# Patient Record
Sex: Female | Born: 1955 | Race: White | Hispanic: No | Marital: Single | Smoking: Current every day smoker
Health system: Southern US, Community
[De-identification: ages and names within clinical notes are randomized; demographics above are authoritative.]

## PROBLEM LIST (undated history)

## (undated) DIAGNOSIS — E039 Hypothyroidism, unspecified: Secondary | ICD-10-CM

## (undated) DIAGNOSIS — M5430 Sciatica, unspecified side: Secondary | ICD-10-CM

## (undated) DIAGNOSIS — F1910 Other psychoactive substance abuse, uncomplicated: Secondary | ICD-10-CM

## (undated) DIAGNOSIS — I10 Essential (primary) hypertension: Secondary | ICD-10-CM

## (undated) DIAGNOSIS — E785 Hyperlipidemia, unspecified: Secondary | ICD-10-CM

## (undated) DIAGNOSIS — I2510 Atherosclerotic heart disease of native coronary artery without angina pectoris: Secondary | ICD-10-CM

## (undated) DIAGNOSIS — B1920 Unspecified viral hepatitis C without hepatic coma: Secondary | ICD-10-CM

## (undated) DIAGNOSIS — F419 Anxiety disorder, unspecified: Secondary | ICD-10-CM

## (undated) DIAGNOSIS — J449 Chronic obstructive pulmonary disease, unspecified: Secondary | ICD-10-CM

## (undated) DIAGNOSIS — K219 Gastro-esophageal reflux disease without esophagitis: Secondary | ICD-10-CM

## (undated) DIAGNOSIS — I214 Non-ST elevation (NSTEMI) myocardial infarction: Secondary | ICD-10-CM

## (undated) HISTORY — PX: UPPER GI ENDOSCOPY: SHX6162

## (undated) HISTORY — DX: Unspecified viral hepatitis C without hepatic coma: B19.20

## (undated) HISTORY — PX: TONSILLECTOMY: SUR1361

## (undated) HISTORY — DX: Atherosclerotic heart disease of native coronary artery without angina pectoris: I25.10

## (undated) HISTORY — DX: Non-ST elevation (NSTEMI) myocardial infarction: I21.4

## (undated) HISTORY — DX: Chronic obstructive pulmonary disease, unspecified: J44.9

## (undated) HISTORY — DX: Hypothyroidism, unspecified: E03.9

## (undated) HISTORY — PX: TUBAL LIGATION: SHX77

## (undated) HISTORY — PX: OTHER SURGICAL HISTORY: SHX169

## (undated) HISTORY — DX: Other psychoactive substance abuse, uncomplicated: F19.10

---

## 1997-08-20 ENCOUNTER — Inpatient Hospital Stay (HOSPITAL_COMMUNITY): Admission: AD | Admit: 1997-08-20 | Discharge: 1997-08-24 | Payer: Self-pay | Admitting: Psychiatry

## 1999-11-04 ENCOUNTER — Inpatient Hospital Stay (HOSPITAL_COMMUNITY): Admission: AD | Admit: 1999-11-04 | Discharge: 1999-11-08 | Payer: Self-pay | Admitting: Psychiatry

## 2001-05-27 ENCOUNTER — Other Ambulatory Visit: Admission: RE | Admit: 2001-05-27 | Discharge: 2001-05-27 | Payer: Self-pay | Admitting: Internal Medicine

## 2002-05-25 ENCOUNTER — Inpatient Hospital Stay (HOSPITAL_COMMUNITY): Admission: EM | Admit: 2002-05-25 | Discharge: 2002-05-29 | Payer: Self-pay | Admitting: Psychiatry

## 2003-04-07 ENCOUNTER — Emergency Department (HOSPITAL_COMMUNITY): Admission: EM | Admit: 2003-04-07 | Discharge: 2003-04-07 | Payer: Self-pay | Admitting: Emergency Medicine

## 2003-10-26 ENCOUNTER — Ambulatory Visit (HOSPITAL_COMMUNITY): Admission: RE | Admit: 2003-10-26 | Discharge: 2003-10-26 | Payer: Self-pay | Admitting: General Surgery

## 2004-06-04 ENCOUNTER — Inpatient Hospital Stay (HOSPITAL_COMMUNITY): Admission: EM | Admit: 2004-06-04 | Discharge: 2004-06-07 | Payer: Self-pay | Admitting: Emergency Medicine

## 2004-06-26 ENCOUNTER — Emergency Department (HOSPITAL_COMMUNITY): Admission: EM | Admit: 2004-06-26 | Discharge: 2004-06-26 | Payer: Self-pay | Admitting: Emergency Medicine

## 2005-07-04 ENCOUNTER — Emergency Department (HOSPITAL_COMMUNITY): Admission: EM | Admit: 2005-07-04 | Discharge: 2005-07-04 | Payer: Self-pay | Admitting: Emergency Medicine

## 2005-11-14 ENCOUNTER — Emergency Department (HOSPITAL_COMMUNITY): Admission: EM | Admit: 2005-11-14 | Discharge: 2005-11-15 | Payer: Self-pay | Admitting: Emergency Medicine

## 2005-11-23 ENCOUNTER — Emergency Department (HOSPITAL_COMMUNITY): Admission: EM | Admit: 2005-11-23 | Discharge: 2005-11-23 | Payer: Self-pay | Admitting: Emergency Medicine

## 2005-11-27 ENCOUNTER — Ambulatory Visit (HOSPITAL_COMMUNITY): Admission: RE | Admit: 2005-11-27 | Discharge: 2005-11-27 | Payer: Self-pay | Admitting: Family Medicine

## 2005-12-14 ENCOUNTER — Emergency Department (HOSPITAL_COMMUNITY): Admission: EM | Admit: 2005-12-14 | Discharge: 2005-12-14 | Payer: Self-pay | Admitting: Emergency Medicine

## 2005-12-16 ENCOUNTER — Emergency Department (HOSPITAL_COMMUNITY): Admission: EM | Admit: 2005-12-16 | Discharge: 2005-12-16 | Payer: Self-pay | Admitting: Emergency Medicine

## 2006-01-02 ENCOUNTER — Emergency Department (HOSPITAL_COMMUNITY): Admission: EM | Admit: 2006-01-02 | Discharge: 2006-01-03 | Payer: Self-pay | Admitting: Emergency Medicine

## 2006-01-04 ENCOUNTER — Emergency Department (HOSPITAL_COMMUNITY): Admission: EM | Admit: 2006-01-04 | Discharge: 2006-01-04 | Payer: Self-pay | Admitting: Emergency Medicine

## 2006-01-23 ENCOUNTER — Emergency Department (HOSPITAL_COMMUNITY): Admission: EM | Admit: 2006-01-23 | Discharge: 2006-01-23 | Payer: Self-pay | Admitting: Emergency Medicine

## 2006-04-15 ENCOUNTER — Emergency Department (HOSPITAL_COMMUNITY): Admission: EM | Admit: 2006-04-15 | Discharge: 2006-04-15 | Payer: Self-pay | Admitting: Emergency Medicine

## 2006-05-03 ENCOUNTER — Emergency Department (HOSPITAL_COMMUNITY): Admission: EM | Admit: 2006-05-03 | Discharge: 2006-05-03 | Payer: Self-pay | Admitting: Emergency Medicine

## 2006-05-17 ENCOUNTER — Emergency Department (HOSPITAL_COMMUNITY): Admission: EM | Admit: 2006-05-17 | Discharge: 2006-05-17 | Payer: Self-pay | Admitting: Emergency Medicine

## 2006-07-13 ENCOUNTER — Emergency Department (HOSPITAL_COMMUNITY): Admission: EM | Admit: 2006-07-13 | Discharge: 2006-07-13 | Payer: Self-pay | Admitting: Emergency Medicine

## 2006-08-22 ENCOUNTER — Emergency Department (HOSPITAL_COMMUNITY): Admission: EM | Admit: 2006-08-22 | Discharge: 2006-08-22 | Payer: Self-pay | Admitting: Emergency Medicine

## 2006-09-02 ENCOUNTER — Emergency Department (HOSPITAL_COMMUNITY): Admission: EM | Admit: 2006-09-02 | Discharge: 2006-09-02 | Payer: Self-pay | Admitting: *Deleted

## 2006-09-05 ENCOUNTER — Ambulatory Visit: Payer: Self-pay | Admitting: Psychiatry

## 2006-09-05 ENCOUNTER — Inpatient Hospital Stay (HOSPITAL_COMMUNITY): Admission: AD | Admit: 2006-09-05 | Discharge: 2006-09-08 | Payer: Self-pay | Admitting: Psychiatry

## 2006-09-20 ENCOUNTER — Emergency Department (HOSPITAL_COMMUNITY): Admission: EM | Admit: 2006-09-20 | Discharge: 2006-09-20 | Payer: Self-pay | Admitting: Emergency Medicine

## 2007-07-08 ENCOUNTER — Emergency Department (HOSPITAL_COMMUNITY): Admission: EM | Admit: 2007-07-08 | Discharge: 2007-07-08 | Payer: Self-pay | Admitting: Emergency Medicine

## 2008-03-13 ENCOUNTER — Emergency Department (HOSPITAL_COMMUNITY): Admission: EM | Admit: 2008-03-13 | Discharge: 2008-03-14 | Payer: Self-pay | Admitting: Emergency Medicine

## 2008-04-12 ENCOUNTER — Encounter: Payer: Self-pay | Admitting: Orthopedic Surgery

## 2008-04-15 ENCOUNTER — Emergency Department (HOSPITAL_COMMUNITY): Admission: EM | Admit: 2008-04-15 | Discharge: 2008-04-15 | Payer: Self-pay | Admitting: Emergency Medicine

## 2008-04-19 ENCOUNTER — Emergency Department (HOSPITAL_COMMUNITY): Admission: EM | Admit: 2008-04-19 | Discharge: 2008-04-20 | Payer: Self-pay | Admitting: Emergency Medicine

## 2008-05-01 ENCOUNTER — Ambulatory Visit: Payer: Self-pay | Admitting: Orthopedic Surgery

## 2008-05-01 DIAGNOSIS — M5137 Other intervertebral disc degeneration, lumbosacral region: Secondary | ICD-10-CM | POA: Insufficient documentation

## 2008-05-01 DIAGNOSIS — M5126 Other intervertebral disc displacement, lumbar region: Secondary | ICD-10-CM | POA: Insufficient documentation

## 2008-05-01 DIAGNOSIS — M549 Dorsalgia, unspecified: Secondary | ICD-10-CM | POA: Insufficient documentation

## 2008-05-02 ENCOUNTER — Telehealth: Payer: Self-pay | Admitting: Orthopedic Surgery

## 2008-05-03 ENCOUNTER — Ambulatory Visit (HOSPITAL_COMMUNITY): Admission: RE | Admit: 2008-05-03 | Discharge: 2008-05-03 | Payer: Self-pay | Admitting: Orthopedic Surgery

## 2008-05-04 ENCOUNTER — Emergency Department (HOSPITAL_COMMUNITY): Admission: EM | Admit: 2008-05-04 | Discharge: 2008-05-04 | Payer: Self-pay | Admitting: Emergency Medicine

## 2008-05-08 ENCOUNTER — Ambulatory Visit: Payer: Self-pay | Admitting: Orthopedic Surgery

## 2008-05-12 ENCOUNTER — Telehealth: Payer: Self-pay | Admitting: Orthopedic Surgery

## 2008-05-16 ENCOUNTER — Telehealth: Payer: Self-pay | Admitting: Orthopedic Surgery

## 2008-05-25 ENCOUNTER — Encounter: Payer: Self-pay | Admitting: Orthopedic Surgery

## 2008-08-25 ENCOUNTER — Emergency Department (HOSPITAL_COMMUNITY): Admission: EM | Admit: 2008-08-25 | Discharge: 2008-08-25 | Payer: Self-pay | Admitting: Emergency Medicine

## 2008-10-16 ENCOUNTER — Emergency Department (HOSPITAL_COMMUNITY): Admission: EM | Admit: 2008-10-16 | Discharge: 2008-10-16 | Payer: Self-pay | Admitting: Emergency Medicine

## 2008-10-17 ENCOUNTER — Emergency Department (HOSPITAL_COMMUNITY): Admission: EM | Admit: 2008-10-17 | Discharge: 2008-10-17 | Payer: Self-pay | Admitting: Emergency Medicine

## 2008-10-19 ENCOUNTER — Emergency Department (HOSPITAL_COMMUNITY): Admission: EM | Admit: 2008-10-19 | Discharge: 2008-10-19 | Payer: Self-pay | Admitting: Emergency Medicine

## 2008-11-28 ENCOUNTER — Encounter
Admission: RE | Admit: 2008-11-28 | Discharge: 2008-12-05 | Payer: Self-pay | Admitting: Physical Medicine & Rehabilitation

## 2008-12-05 ENCOUNTER — Ambulatory Visit: Payer: Self-pay | Admitting: Physical Medicine & Rehabilitation

## 2008-12-29 ENCOUNTER — Emergency Department (HOSPITAL_COMMUNITY): Admission: EM | Admit: 2008-12-29 | Discharge: 2008-12-29 | Payer: Self-pay | Admitting: Emergency Medicine

## 2009-08-09 ENCOUNTER — Encounter: Payer: Self-pay | Admitting: Orthopedic Surgery

## 2009-08-09 ENCOUNTER — Telehealth: Payer: Self-pay | Admitting: Orthopedic Surgery

## 2009-10-09 ENCOUNTER — Emergency Department (HOSPITAL_COMMUNITY): Admission: EM | Admit: 2009-10-09 | Discharge: 2009-10-09 | Payer: Self-pay | Admitting: Emergency Medicine

## 2009-12-19 ENCOUNTER — Emergency Department (HOSPITAL_COMMUNITY): Admission: EM | Admit: 2009-12-19 | Discharge: 2009-12-19 | Payer: Self-pay | Admitting: Emergency Medicine

## 2010-04-25 ENCOUNTER — Emergency Department (HOSPITAL_COMMUNITY)
Admission: EM | Admit: 2010-04-25 | Discharge: 2010-04-25 | Payer: Self-pay | Source: Home / Self Care | Admitting: Emergency Medicine

## 2010-04-25 LAB — DIFFERENTIAL
Basophils Absolute: 0 10*3/uL (ref 0.0–0.1)
Basophils Relative: 1 % (ref 0–1)
Eosinophils Absolute: 0.3 10*3/uL (ref 0.0–0.7)
Eosinophils Relative: 6 % — ABNORMAL HIGH (ref 0–5)
Lymphocytes Relative: 43 % (ref 12–46)
Lymphs Abs: 2.1 10*3/uL (ref 0.7–4.0)
Monocytes Absolute: 0.4 10*3/uL (ref 0.1–1.0)
Monocytes Relative: 9 % (ref 3–12)
Neutro Abs: 2 10*3/uL (ref 1.7–7.7)
Neutrophils Relative %: 41 % — ABNORMAL LOW (ref 43–77)

## 2010-04-25 LAB — BASIC METABOLIC PANEL
BUN: 12 mg/dL (ref 6–23)
CO2: 33 mEq/L — ABNORMAL HIGH (ref 19–32)
Calcium: 9.4 mg/dL (ref 8.4–10.5)
Chloride: 95 mEq/L — ABNORMAL LOW (ref 96–112)
Creatinine, Ser: 0.72 mg/dL (ref 0.4–1.2)
GFR calc Af Amer: >60 mL/min (ref >60)
GFR calc non Af Amer: >60 mL/min (ref >60)
Glucose, Bld: 97 mg/dL (ref 70–99)
Potassium: 3.5 mEq/L (ref 3.5–5.1)
Sodium: 136 mEq/L (ref 135–145)

## 2010-04-25 LAB — CBC
HCT: 34.7 % — ABNORMAL LOW (ref 36.0–46.0)
Hemoglobin: 12.2 g/dL (ref 12.0–15.0)
MCH: 31.4 pg (ref 26.0–34.0)
MCHC: 35.2 g/dL (ref 30.0–36.0)
MCV: 89.4 fL (ref 78.0–100.0)
Platelets: 205 10*3/uL (ref 150–400)
RBC: 3.88 MIL/uL (ref 3.87–5.11)
RDW: 13.7 % (ref 11.5–15.5)
WBC: 4.8 10*3/uL (ref 4.0–10.5)

## 2010-04-25 LAB — URINALYSIS, ROUTINE W REFLEX MICROSCOPIC
Bilirubin Urine: NEGATIVE
Hemoglobin, Urine: NEGATIVE
Ketones, ur: NEGATIVE mg/dL
Nitrite: NEGATIVE
Protein, ur: NEGATIVE mg/dL
Specific Gravity, Urine: 1.01 (ref 1.005–1.030)
Urine Glucose, Fasting: NEGATIVE mg/dL
Urobilinogen, UA: 0.2 mg/dL (ref 0.0–1.0)
pH: 6 (ref 5.0–8.0)

## 2010-05-12 ENCOUNTER — Encounter: Payer: Self-pay | Admitting: Orthopedic Surgery

## 2010-05-21 NOTE — Progress Notes (Signed)
Summary: Medical records request [facility]  Phone Note Other Incoming   Caller: [facility], [place] medical records Summary of Call: Medical records/Health Information department called to request copy of patient's medical records, states patient admitted 08/08/09.  Rec'd signed authorization.    I called/ Followed up with [facility] re: referral to their office, as no report received.  Per Susie at [facility], patient an an appointment in August 2010 w/Dr Lovell Sheehan and was a no show.  She was re-scheduled for an appt in September 2010 with Dr Franky Macho, and had cancelled.  Patient has not re-scheduled.  Faxed records as requested to [facility] at fax # [phone]. Initial call taken by: Cammie Sickle,  August 09, 2009 9:21 AM

## 2010-05-21 NOTE — Letter (Signed)
Summary: Medical records request [facility]  Medical records request [facility]   Imported By: Cammie Sickle 08/11/2009 10:51:56  _____________________________________________________________________  External Attachment:    Type:   Image     Comment:   External Document

## 2010-05-21 NOTE — Progress Notes (Signed)
Summary: can she have pain med  Phone Note Call from Patient Call back at [facility] Phone [phone]   Summary of Call: pt states that neurontin not helping much,needs pain med, IVP dye and demerol allergies,  mri appt tomorrow, will call in valium for her today, [place] [employer].   What can she take for pain? Initial call taken by: Ether Griffins,  May 02, 2008 10:39 AM  Follow-up for Phone Call        tell her she has to get any pain meds from her pcp and the health dept  see patient instructions from last ov Follow-up by: Fuller Canada MD,  May 02, 2008 2:37 PM  Additional Follow-up for Phone Call Additional follow up Details #1::        advised pt Additional Follow-up by: Ether Griffins,  May 02, 2008 3:00 PM

## 2010-05-21 NOTE — Letter (Signed)
Summary: *Referral Letter  [facility]  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  Sincerely,  Fuller Canada MD

## 2010-05-21 NOTE — Assessment & Plan Note (Signed)
Summary: mri results from ap/frs    History of Present Illness: I saw [name] in the office today for a followup visit.  She is a 55 years old woman with the complaint of:  lowback pain. 100 percent discount [place].  Patient states she is not much better.  Review MRI results today. Neurontin 100 three times a day, does not help.  IMPRESSION: Moderately large left-sided disc protrusion at L4-5 with impingement of the left L4 and L5 nerve roots.  There is mild to moderate spinal stenosis.   Central disc protrusion at L5-S1.  There is also left foraminal narrowing L5-S1 due to spurring   Read By:  Camelia Phenes,  M.D.     Released By:  Camelia Phenes,  M.D.     Current Allergies: ! * IVP DYE ! DEMEROL        Impression & Recommendations:  Problem # 1:  H N P-LUMBAR (ICD-722.10) Assessment: Unchanged  Orders: Neurosurgeon Referral (Neurosurgeon) Misc. Referral (Misc. Ref) Est. 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 Referral (Misc. Ref) Est. Patient Level III (47829)   Problem # 3:  BACK PAIN (ICD-724.5) Assessment: Unchanged The MRI was done at [facility] and it was reviewed with the report   I would think she will need epidural injection and a neurosurgical evaluation. Unfortunately she has not ensured. We will do the best we can. She is referred back to the health department for pain management. She was given a dose pack to help with the irritation of the L5 nerve root. Orders: Neurosurgeon Referral (Neurosurgeon) Misc. Referral (Misc. Ref) Est. Patient Level III (56213)   Medications Added to Medication List This Visit: 1)  Sterapred Ds 12 Day 10 Mg Tabs (Prednisone) .... As directed   Patient Instructions: 1)  Refer to neurosurgeon, no insurance, has 100 percent discount through [facility] only. 2)  Advised  patient that she may be denied due  to no insurance. 3)  Refer for ESI L4/L5, series of 3 injections. 4)  Refer back to Health Department of pain management.    Prescriptions: STERAPRED DS 12 DAY 10 MG TABS (PREDNISONE) as directed  #1 x 1   Entered and Authorized by:   Fuller Canada MD   Signed by:   Fuller Canada MD on 05/08/2008   Method used:   Print then Give to Patient   RxID:   [id number]

## 2010-05-21 NOTE — Letter (Signed)
Summary: [facility] discount verification  [facility] discount verification   Imported By: Cammie Sickle 04/18/2008 14:36:51  _____________________________________________________________________  External Attachment:    Type:   Image     Comment:   External Document

## 2010-05-21 NOTE — Letter (Signed)
Summary: *Orthopedic Consult Note  [facility]  [address]  [place], [place] [address]   Phone: [phone]  Fax: [phone]    Re:    LAVERNE HURSEY DOB:    [birth year]   Dear: Dr. Dimas Aguas   Thank you for requesting that we see the above patient for consultation.  A copy of the detailed office note will be sent under separate cover, for your review.  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  Sincerely,   Terrance Mass. MD.

## 2010-05-21 NOTE — Progress Notes (Signed)
Summary: Neurosurgeon appointment.  Phone Note Outgoing Call   Call placed by: Waldon Reining,  May 16, 2008 3:24 PM Call placed to: Patient Action Taken: Appt scheduled Summary of Call: Patient has an appointment at [facility] with Dr. Lovell Sheehan on 06-27-08 at 9:50 am. Patient does not have insurance, so she will have to pay $200 up front. Patient needs to take all films with her.

## 2010-05-21 NOTE — Progress Notes (Signed)
Summary: MRI appointment.  Phone Note Outgoing Call   Call placed by: Waldon Reining,  May 02, 2008 1:27 PM Call placed to: Patient Action Taken: Phone Call Completed, Appt scheduled Summary of Call: I called to give patient her MRI appointment at [facility] on 05-03-08 at 11:30. Patient has 100% [place] discount. She will follow up back here.

## 2010-05-21 NOTE — Letter (Signed)
Summary: *Orthopedic Consult Note  [facility]  [address]  [place], [place] [address]   Phone: [phone]  Fax: [phone]    Re:    LEZLEY BEDGOOD DOB:    Nov 03, 1955   Dear: Karoline Caldwell   Thank you for requesting that we see the above patient for consultation.  A copy of the detailed office note will be sent under separate cover, for your review.  Evaluation today is consistent with: L4-5 disc protrusion with compression of the L5 nerve root on the LEFT associated with moderate spinal stenosis.   Our recommendation is for: epidural injection at L4-5 and neurosurgical followup.  As you know, we do not treat disc herniations and chronic back pain. We did not manage chronic pain. We have met our due diligence to see the patient ref: back pain; and have done the evaluation and made recommendation for treatment. If she needs any pain medication you  will have to manage this as her primary care physician. We did not use narcotics for conditions of the lumbar spine such as disc herniations and spinal stenosis.  Thank you for this opportunity to look after your patient.  Sincerely,   Terrance Mass. MD.

## 2010-05-21 NOTE — Letter (Signed)
Summary: Historic Patient File  Historic Patient File   Imported By: Elvera Maria 05/02/2008 10:17:34  _____________________________________________________________________  External Attachment:    Type:   Image     Comment:   history

## 2010-05-21 NOTE — Letter (Signed)
Summary: [facility] note  [facility] note   Imported By: Cammie Sickle 05/30/2008 10:16:58  _____________________________________________________________________  External Attachment:    Type:   Image     Comment:   External Document

## 2010-05-21 NOTE — Progress Notes (Signed)
Summary: Neurosurgeon referral.  Phone Note Outgoing Call   Call placed by: Waldon Reining,  May 12, 2008 9:38 AM Call placed to: Specialist Action Taken: Information Sent Summary of Call: I faxed a referral for this patient to [facility] to be seen for her lumbar spine.
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
## 2010-05-21 NOTE — Progress Notes (Signed)
Summary: ESI referral.  Phone Note Outgoing Call   Call placed by: Waldon Reining,  May 12, 2008 9:36 AM Call placed to: Specialist Action Taken: Information Sent Summary of Call: I faxed a referral to [facility] for this patient to have ESI's in her lumbar spine at L4-L5.

## 2010-05-30 ENCOUNTER — Emergency Department (HOSPITAL_COMMUNITY)
Admission: EM | Admit: 2010-05-30 | Discharge: 2010-05-30 | Disposition: A | Discharge status: Home / Self Care | Payer: Self-pay | Attending: Emergency Medicine | Admitting: Emergency Medicine

## 2010-05-30 DIAGNOSIS — M5430 Sciatica, unspecified side: Secondary | ICD-10-CM | POA: Insufficient documentation

## 2010-05-30 DIAGNOSIS — Z8619 Personal history of other infectious and parasitic diseases: Secondary | ICD-10-CM | POA: Insufficient documentation

## 2010-05-30 DIAGNOSIS — M545 Low back pain, unspecified: Secondary | ICD-10-CM | POA: Insufficient documentation

## 2010-05-30 DIAGNOSIS — G8929 Other chronic pain: Secondary | ICD-10-CM | POA: Insufficient documentation

## 2010-05-30 DIAGNOSIS — R220 Localized swelling, mass and lump, head: Secondary | ICD-10-CM | POA: Insufficient documentation

## 2010-05-30 DIAGNOSIS — K089 Disorder of teeth and supporting structures, unspecified: Secondary | ICD-10-CM | POA: Insufficient documentation

## 2010-05-30 DIAGNOSIS — F411 Generalized anxiety disorder: Secondary | ICD-10-CM | POA: Insufficient documentation

## 2010-05-30 DIAGNOSIS — M79609 Pain in unspecified limb: Secondary | ICD-10-CM | POA: Insufficient documentation

## 2010-05-30 DIAGNOSIS — I10 Essential (primary) hypertension: Secondary | ICD-10-CM | POA: Insufficient documentation

## 2010-05-30 DIAGNOSIS — K219 Gastro-esophageal reflux disease without esophagitis: Secondary | ICD-10-CM | POA: Insufficient documentation

## 2010-05-30 DIAGNOSIS — Z79899 Other long term (current) drug therapy: Secondary | ICD-10-CM | POA: Insufficient documentation

## 2010-07-19 ENCOUNTER — Emergency Department (HOSPITAL_COMMUNITY)
Admission: EM | Admit: 2010-07-19 | Discharge: 2010-07-19 | Disposition: A | Discharge status: Home / Self Care | Payer: Self-pay | Attending: Emergency Medicine | Admitting: Emergency Medicine

## 2010-07-19 DIAGNOSIS — M549 Dorsalgia, unspecified: Secondary | ICD-10-CM | POA: Insufficient documentation

## 2010-07-19 DIAGNOSIS — G43909 Migraine, unspecified, not intractable, without status migrainosus: Secondary | ICD-10-CM | POA: Insufficient documentation

## 2010-07-19 DIAGNOSIS — G8929 Other chronic pain: Secondary | ICD-10-CM | POA: Insufficient documentation

## 2010-09-03 NOTE — Discharge Summary (Signed)
[name], [name]                ACCOUNT NO.:  [id number]   MEDICAL RECORD NO.:  [id number]          PATIENT TYPE:  IPS   LOCATION:  0502                          FACILITY:  [facility]   PHYSICIAN:  Anselm Jungling, MD  DATE OF BIRTH:  [birth year]   DATE OF ADMISSION:  09/05/2006  DATE OF DISCHARGE:  09/08/2006                               DISCHARGE SUMMARY   IDENTIFYING DATA/REASON FOR ADMISSION:  The patient is a 54 year old  divorced white female who came to [place] via [facility].  She was  admitted for polysubstance dependence and detoxification.  Her son had  petitioned her, reporting abuse of Xanax and Flexeril.  She had passed  out in her car.  DWI discharges were pending.  Please refer to the  admission note for further details pertaining to the symptoms,  circumstances and history that led to her hospitalization.   INITIAL DIAGNOSTIC IMPRESSION:  She was given initial AXIS I diagnosis  of polysubstance dependence not otherwise specified.   MEDICAL/LABORATORY:  The patient was medically and physically assessed  by the psychiatric nurse practitioner.  She came to [place] with a history of  hypertension, migraine, renal stones, and GERD.  She was continued on  lisinopril, Pepcid, and hydrochlorothiazide.  She had also been taking  Flexeril, and according to her son, abusing it.  Skelaxin 800 mg b.i.d.  was substituted.   HOSPITAL COURSE:  The patient was admitted to the adult inpatient  psychiatric service.  In the initial interview, she presented as a well-  nourished, well-developed woman in bed, disheveled, groggy, who gave few  responses, and made very little effort to interact with me at all.  However, there were no signs or symptoms of psychosis or thought  disorder, and she appeared to be oriented in all spheres.   The patient was placed on a Librium withdrawal protocol to address Xanax  cessation.  This proceeded uneventfully.  The patient was encouraged to  participate  in therapeutic groups and activities, but refused, instead  remaining in bed.  Her four-day hospital stay was characterized  throughout by her isolativeness, and refusal to participate.   She was still refusing to attend groups and showed little motivation or  interest in treatment.  She was discharged as it was felt that there was  no further benefit from her continued stay.  She did comment prior to  discharge that she felt that it had been in her best interest to be  admitted to the hospital for treatment.  Social work and case management  were in contact with her son during her inpatient stay.   AFTERCARE:  Aftercare was arranged for the patient to attend the Alcohol  and Drug Services program in [place].  The patient was given  instructions on how to apply for services there.   DISCHARGE MEDICATIONS:  1. Lisinopril 20 mg daily.  2. Pepcid 20 mg twice daily or Zantac 150 mg twice daily.  3. Skelaxin 800 mg twice daily.  4. Hydrochlorothiazide 25 mg daily.   DISCHARGE DIAGNOSES:  AXIS I:  Polysubstance dependence, early  remission.  AXIS II:  Deferred.  AXIS III:  History of back pain, gastroesophageal reflux disease,  hypertension.  AXIS IV:  Stressors:  Severe.  AXIS V:  GAF on discharge 55.      Anselm Jungling, MD  Electronically Signed     SPB/MEDQ  D:  09/15/2006  T:  09/15/2006  Job:  [id number]

## 2010-09-03 NOTE — Group Therapy Note (Signed)
Referral for chronic back pain.   CHIEF COMPLAINT:  Left lower extremity pain.  Average pain 8-9,  described as sharp, burning, and aching.  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  Workup has included a lumbar MRI in January 2010 demonstrating L4-5 disk  protrusion leftward impinging upon the left L5 nerve root.   Pain exacerbating factors are bending, sitting, inactivity, improves  with standing and medication.  Relief from meds is fair even with  narcotic analgesics.  She is functionally independent.  Her last date of  employment was on November 23, 2008 due to temporary nature.   REVIEW OF SYSTEMS:  Positive for weakness, numbness, trouble walking,  spasms, depression, anxiety, urinary retention, and night sweats.   PAST HISTORY:  Kidney stones, hypertension, stomach problems, and GERD.   PAST SURGICAL HISTORY:  C-section, tonsillectomy, D and C, negative for  spine surgery.   SOCIAL HISTORY:  Single, lives with her mother and father.  She has  history illegal drug use.  She had an overdose about a year ago on  prescription drugs.  Smokes pack a day and does not use alcohol.   FAMILY HISTORY:  Heart disease, diabetes, and high blood pressure.   PSYCHIATRIC PROBLEMS:  Alcohol abuse, drug abuse, and disability.   CURRENT MEDICATIONS:  1. Butalbital for headaches per [facility].  2. Ranitidine per Vertis Kelch.  3. Hydrocodone b.i.d. per Dr. Janna Arch.  4. Alprazolam per Dr. [name] q.i.d.  5. Lisinopril.  6. Diclofenac.  7. Simvastatin per [facility]is Kelch [facility].   PHYSICAL EXAMINATION:  GENERAL:  No acute stress.  Mood and affect is  appropriate.  Gait is normal.  She is able to toe walk and has some mild  difficulty with heel walk on the left side.  Straight leg raising test  is negative.  EXTREMITIES:  Her motor strength is 5/5 in bilateral upper and lower  extremities with  exception of ankle dorsiflexion on the left side, which  is 4/5.  She has positive straight leg raise test on the left side.  Deep tendon reflexes are normal bilaterally and the lower extremity  sensation mildly reduced in the left L-4 and 5 dermatomes.  Hip, knee,  and ankle range of motion strength is normal.  No evidence of joint  effusions.  Upper extremity strength range of motion are normal.   IMPRESSION:  L5 radiculitis due to L5 herniated nucleus pulposus disk  protrusion.   RECOMMENDATIONS:  1. Given her prior problems with narcotic analgesics, I would not      recommend long-term use and I will not be prescribing.  2. Referral to physical therapy for lumbar spine stabilization.  3. Left L5 transforaminal lumbar epidural steroid injection.  4. Failing conservative care.  We would refer to surgery to evaluate      for laminectomy and diskectomy.      Erick Colace, M.D.  Electronically Signed     AEK/MedQ  D:  12/05/2008 11:12:08  T:  12/05/2008 13:10:09  Job #:  [id number]   cc:   Melvyn Novas, MD  Fax: [phone]
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
## 2010-09-06 NOTE — Discharge Summary (Signed)
[facility]  Patient:    [name], [name]                         MRN: [id number] Adm. Date:  [id number] Disc. Date: [id number] Attending:  Beryle Beams                           Discharge Summary  NO DICTATION. DD:  11/08/99 TD:  11/10/99 Job: [id number]
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
## 2010-09-06 NOTE — Discharge Summary (Signed)
[name], [name]                ACCOUNT NO.:  [id number]   MEDICAL RECORD NO.:  [id number]          PATIENT TYPE:  INP   LOCATION:  A227                          FACILITY:  [facility]   PHYSICIAN:  Calvert Cantor, M.D.     DATE OF BIRTH:  09-Aug-1955   DATE OF ADMISSION:  06/04/2004  DATE OF DISCHARGE:  02/17/2006LH                                 DISCHARGE SUMMARY   DISCHARGE DIAGNOSES:  1.  Excessive use of benzodiazepines with admission for benzodiazepine      withdrawal.  2.  Hypokalemia.  3.  Hyponatremia secondary to dehydration.  4.  Narcotic abuse.  5.  Smoking.  6.  Hypertension.   DISCHARGE MEDICATIONS:  1.  Ultram 100 mg t.i.d. p.r.n.  2.  Phenergan 12.5 mg q.4-6h. p.r.n. nausea and vomiting.  3.  Lisinopril 10 mg a day.  4.  Hydrochlorothiazide 12.5 mg a day.   HOSPITAL COURSE:  This is a 55 year old white female who was admitted after  she had been out of Xanax for about 2 days.  The patient was admitted with  anxiety, and subsequently a detox protocol was followed for benzodiazepine  withdrawal.  The patient continued to have nausea, vomiting, and anxiety  that day and the following day.  She was initially maintained on Ativan 1 mg  q.3h. p.r.n.; however, the third day, due to lack of improvement, the  patient was switched to Librium 25 mg t.i.d., and Phenergan for nausea and  vomiting.  These medications helped her anxiety and her nausea and vomiting;  however, her pain was still slightly uncontrolled on Ultram and on Toradol.  Therefore, Motrin was added.  On the day before discharge, the patient  developed a migraine headache, and on the day of discharge, she had  continuation of the headache with elevated blood pressure and tachycardia  secondary to the pain.  The patient was given Nubain and Phenergan for her  headache, which helped improve it, and therefore she was discharged later  that day.  She was evaluated by the ACT team, and did not meet criteria for  inpatient treatment.  Therefore, she was going to be set up for an  outpatient appointment for the following Monday morning with mental health.  This appointment is mainly for excessive use of narcotics and  benzodiazepines, and withdrawal from benzodiazepines and narcotics.  The  patient said that she would follow up with them.  On discharge, all of her  medications were returned to her, which included Fioricet and Xanax.  Although the patient strongly stated that she was going to avoid these  medications, it is not sure whether or not she can do this alone.  The  patient is being discharged in stable condition, no longer feeling anxious.  No nausea, no vomiting; however, she does still have a slight headache.  She  is to follow up on Monday with mental health as per her appointment.   LABORATORY DATA:  Blood work on June 05, 2004 showed a white count of  4.3, hemoglobin 11.6, hematocrit 33, platelets normal at 232.  Her MET  panel  showed a sodium of 130, which had increased from an admission sodium of 124.  Potassium was 4.8, which had increased from 2.8 on admission.  Chloride was  104, increasing from 91 on admission.  Bicarb was 24, glucose 80, BUN 8,  creatinine 0.7.  LFT's showed a normal total  bilirubin of 0.4, alkaline phosphatase 110, AST 19, ALT 25, calcium 7.8,  with an albumin of 3.1.  Her urine drug screen was positive for barbiturates  and benzodiazepines.  Her alcohol level was negative, and her urinalysis was  normal.      SR/MEDQ  D:  06/10/2004  T:  06/10/2004  Job:  [id number]

## 2010-09-06 NOTE — Discharge Summary (Signed)
[name], [name]                            ACCOUNT NO.:  [id number]   MEDICAL RECORD NO.:  [id number]                   PATIENT TYPE:  IPS   LOCATION:  0599                                 [facility]   PHYSICIAN:  Jeanice Lim, M.D.              DATE OF BIRTH:  [birth year]   DATE OF ADMISSION:  05/25/2002  DATE OF DISCHARGE:  05/29/2002                                 DISCHARGE SUMMARY   IDENTIFYING DATA:  This is a [name], divorced,  involuntarily petitioned, relapsed on crack, drinking alcohol and taking too  much Xanax.  Also taking greater than 50 Fioricet for headache within the  last two weeks.   MEDICATIONS:  Xanax 1 mg b.i.d., Fioricet for headache, Seroquel 200 mg  q.h.s. and Prevacid and Ziac.   ALLERGIES:  DEMEROL and IVP DYE.   PHYSICAL EXAMINATION:  Essentially within normal limits.  Neurologically  nonfocal.   LABORATORY DATA:  Alcohol level less than 5.  Urine drug screen positive for  barbiturates, benzodiazepines and cocaine.  Potassium slightly low at 3.3.  Normal EKG.   MENTAL STATUS EXAM:  Somewhat disheveled-looking female fully alert.  No  psychomotor abnormalities, somewhat blunted.  Speech within normal limits.  Mood depressed.  Affect restricted.  Thought processes goal directed.  Thought content positive for defensiveness, justifying drug use, currently  minimizing the severity of her addiction.  Cognitively intact.  Judgment and  insight fair to poor.   ADMISSION DIAGNOSES:   AXIS I:  1. Benzodiazepine abuse and dependence.  2. Cocaine abuse.  3. Opiate abuse.  4. Alcohol abuse.  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   AXIS II:  None.   AXIS III:  1. Hypertension.  2. Migraine headaches.  3. Gastroesophageal reflux disease.  4. Hepatitis C by history.   AXIS IV:  Moderate (problems with primary support group).   AXIS V:  32/62.   HOSPITAL COURSE:  The patient was admitted and ordered routine  p.r.n.  medications and underwent further monitoring.  Was encouraged to participate  in individual, group and milieu therapy.  The patient was placed on a  phenobarbital detox protocol after showing significant withdrawal symptoms.  Restarted on Prevacid and Prozac.  The patient reported a positive response  to clinical intervention.   CONDITION ON DISCHARGE:  Markedly improved.  The patient described  significant decrease in withdrawal symptoms.  Mood was more euthymic.  Affect brighter.  Thought processes goal directed.  Thought content negative  for dangerous ideation or psychotic symptoms.  The patient reported  motivation to be compliant with aftercare instructions including medications  and to avoid substances of abuse.   DISCHARGE MEDICATIONS:  1. Protonix 40 mg q.a.m.  2. Ziac 2.5/6.25 mg q.a.m.  3. Seroquel 200 mg q.h.s.  4. Trazodone 50 mg q.h.s. p.r.n. insomnia.  5. Multivitamin daily.  6. Prozac 10 mg q.a.m.  FOLLOW UP:  The patient is to follow up at [place], [place] after  arriving with her mother for medication management and substance abuse  treatment.   DISCHARGE DIAGNOSES:   AXIS I:  1. Benzodiazepine abuse and dependence.  2. Cocaine abuse.  3. Opiate abuse.  4. Alcohol abuse.  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   AXIS II:  None.   AXIS III:  1. Hypertension.  2. Migraine headaches.  3. Gastroesophageal reflux disease.  4. Hepatitis C by history.   AXIS IV:  Moderate (problems with primary support group).   AXIS V:  Global Assessment of Functioning on discharge 55.                                               Jeanice Lim, M.D.    JEM/MEDQ  D:  06/22/2002  T:  06/22/2002  Job:  [id number]
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
## 2010-09-06 NOTE — H&P (Signed)
[name], [name]                ACCOUNT NO.:  [id number]   MEDICAL RECORD NO.:  [id number]          PATIENT TYPE:  EMS   LOCATION:  ED                            FACILITY:  [facility]   PHYSICIAN:  Osvaldo Shipper, MD     DATE OF BIRTH:  1955/09/13   DATE OF ADMISSION:  06/04/2004  DATE OF DISCHARGE:  LH                                HISTORY & PHYSICAL   PRIMARY CARE PHYSICIAN:  Jerolyn Shin C. Katrinka Blazing, M.D.   ADMISSION DIAGNOSES:  1.  Benzodiazepines withdrawal.  2.  Hyponatremia.  3.  Dehydration.  4.  Polysubstance abuse.  5.  Hypertension.   CHIEF COMPLAINT:  Nausea and vomiting for the past two days.   HISTORY OF PRESENT ILLNESS:  The patient is a [name]  with past medical history of hypertension, polysubstance abuse with multiple  detoxifications for cocaine, also multiple detoxifications for  benzodiazepine withdrawal with the last one being in 2004.  Presented to the  emergency room with a two-day history of nausea, vomiting, and feeling  unwell.  Apparently the patient was on Xanax, but she states she ran out of  it on Sunday, June 02, 2004.  She said her regular dose of Xanax was  about 1 mg 3 times a day, and she has mentioned that she was not over using  them.  The patient mentioned that she was feeling nauseous on Monday and was  throwing up clear to yellow-colored fluid.  Denied any hematemesis.  She has  some upper abdominal discomfort, otherwise no other pain.  She denies any  shortness of breath, fever, or chills.  She denies any diarrhea or  constipation.  The patient also mentioned that the last time she used  cocaine was about three months ago.  She has not done any IV drug use for  more than 10 years.  The patient was referred to the [facility] for  admission, but she was not considered medically stable because of her  hyponatremia and hypokalemia.   HOME MEDICATIONS:  1.  Xanax 1 mg t.i.d.  2.  Lisinopril, unknown dose.  3.   Protonix 40 mg daily.  4.  Fioricet 1 tablet 3 times a day.  5.  The patient was prescribed Tramadol and Phenergan suppositories at      [facility] when she went there two days ago because of some foot      injury.   ALLERGIES:  The patient is allergic to IVP DYE and DEMEROL.   PAST MEDICAL HISTORY:  1.  History of hypertension.  2.  History of kidney stones.  3.  History of polysubstance abuse.  4.  History of migraine headaches.   SOCIAL HISTORY:  The patient smokes about a pack of cigarettes per day.  She  is denying any active drug use.  She mentioned that the last time she used  cocaine and marijuana was a few months ago.  She denies regular alcohol  intake, and last alcohol intake was in December 2005.  She does not work,  and she lives with her parents.  FAMILY HISTORY:  Significant for diabetes and hypertension in both her  parents.   REVIEW OF SYSTEMS:  A 10-point Review of Systems was unremarkable.   PHYSICAL EXAMINATION:  VITAL SIGNS:  She was afebrile.  Blood pressure about  106/70s, heart rate in the 90s.  She was oxygenating above 95% on room air.  GENERAL:  She is a disheveled woman in no apparent distress.  The patient  was not tremulous at this time.  HEENT:  No pallor icterus, no oral lesions seen.  Mucous membranes were  slightly dry.  NECK:  Soft and supple.  LUNGS:  Clear to auscultation bilaterally with no wheezing or rhonchi or  crackles.  CARDIOVASCULAR:  S1, S2 normal and regular.  No murmurs appreciated.  ABDOMEN:  Slight tenderness in the epigastric region.  The remainder of the  abdomen was soft and benign.  Bowel sounds were heard.  No organomegaly was  appreciated.  EXTREMITIES:  Without any edema, and peripheral pulses were palpable.  NEUROLOGIC:  Grossly intact.   LABORATORY DATA AND OTHER STUDIES:  The patient's CBC was unremarkable.  CMP  revealed sodium of 124, potassium 2.8, chloride 91, bicarb 28, BUN 6,  creatinine 0.7, glucose  93.  Alkaline phosphatase was slightly elevated at  122.  Lipase was normal.  Other LFTs were normal.  Urine toxicology screen  came back positive for barbiturates and benzodiazepines.  Noted was the fact  that it was negative for cocaine.  Alcohol level was below normal.  UA was  negative.   IMPRESSION:  This is a 55 year old white female with hypertension and with  history of polysubstance abuse who presents after running out of her Xanax  prescription on Sunday.  She comes in with nausea and vomiting, suggesting  possible benzodiazepine withdrawal, although it is likely a little bit early  for withdrawal symptoms to manifest.  The patient is also dehydrated because  of her nausea and vomiting, and she is hyponatremic.   PLAN:  1.  The patient will be put on IV fluids  2.  Her potassium will be replaced orally and through the IV.  Will check      another potassium level in the night to make sure it has not decreased      further,.  3.  We will put her on Ativan protocol for her benzodiazepine withdrawal.  4.  She will be admitted on the telemetry unit to monitor her vital signs      closely.  5.  For increased agitation, tremulousness, increase in heart rate, she will      be given the Ativan.  6.  If the patient has seizures, this will be initially controlled with      Ativan, and further management will be      decided at that point.  7.  The ACT team will be asked to evaluate the patient again tomorrow      morning.  8.  Once her electrolyte abnormalities have corrected, the patient will be      transferred to a behavioral center.      GK/MEDQ  D:  06/04/2004  T:  06/04/2004  Job:  [id number]   cc:   Dirk Dress. Katrinka Blazing, M.D.  [address]  [place]  [place] [address]  Fax: [phone]

## 2010-09-21 ENCOUNTER — Emergency Department (HOSPITAL_COMMUNITY)
Admission: EM | Admit: 2010-09-21 | Discharge: 2010-09-21 | Disposition: A | Discharge status: Home / Self Care | Payer: Self-pay | Attending: Emergency Medicine | Admitting: Emergency Medicine

## 2010-09-21 DIAGNOSIS — Z79899 Other long term (current) drug therapy: Secondary | ICD-10-CM | POA: Insufficient documentation

## 2010-09-21 DIAGNOSIS — K089 Disorder of teeth and supporting structures, unspecified: Secondary | ICD-10-CM | POA: Insufficient documentation

## 2010-09-21 DIAGNOSIS — G43909 Migraine, unspecified, not intractable, without status migrainosus: Secondary | ICD-10-CM | POA: Insufficient documentation

## 2010-09-21 DIAGNOSIS — I10 Essential (primary) hypertension: Secondary | ICD-10-CM | POA: Insufficient documentation

## 2010-12-08 ENCOUNTER — Emergency Department (HOSPITAL_COMMUNITY)
Admission: EM | Admit: 2010-12-08 | Discharge: 2010-12-08 | Disposition: A | Discharge status: Home / Self Care | Payer: Self-pay | Attending: Emergency Medicine | Admitting: Emergency Medicine

## 2010-12-08 ENCOUNTER — Encounter: Payer: Self-pay | Admitting: *Deleted

## 2010-12-08 ENCOUNTER — Emergency Department (HOSPITAL_COMMUNITY): Payer: Self-pay

## 2010-12-08 DIAGNOSIS — G43909 Migraine, unspecified, not intractable, without status migrainosus: Secondary | ICD-10-CM | POA: Insufficient documentation

## 2010-12-08 DIAGNOSIS — I10 Essential (primary) hypertension: Secondary | ICD-10-CM | POA: Insufficient documentation

## 2010-12-08 DIAGNOSIS — R05 Cough: Secondary | ICD-10-CM | POA: Insufficient documentation

## 2010-12-08 DIAGNOSIS — R059 Cough, unspecified: Secondary | ICD-10-CM | POA: Insufficient documentation

## 2010-12-08 DIAGNOSIS — B182 Chronic viral hepatitis C: Secondary | ICD-10-CM | POA: Insufficient documentation

## 2010-12-08 DIAGNOSIS — J069 Acute upper respiratory infection, unspecified: Secondary | ICD-10-CM

## 2010-12-08 DIAGNOSIS — E785 Hyperlipidemia, unspecified: Secondary | ICD-10-CM | POA: Insufficient documentation

## 2010-12-08 DIAGNOSIS — K029 Dental caries, unspecified: Secondary | ICD-10-CM | POA: Insufficient documentation

## 2010-12-08 DIAGNOSIS — F17200 Nicotine dependence, unspecified, uncomplicated: Secondary | ICD-10-CM | POA: Insufficient documentation

## 2010-12-08 DIAGNOSIS — K219 Gastro-esophageal reflux disease without esophagitis: Secondary | ICD-10-CM | POA: Insufficient documentation

## 2010-12-08 HISTORY — DX: Gastro-esophageal reflux disease without esophagitis: K21.9

## 2010-12-08 HISTORY — DX: Essential (primary) hypertension: I10

## 2010-12-08 HISTORY — DX: Sciatica, unspecified side: M54.30

## 2010-12-08 HISTORY — DX: Anxiety disorder, unspecified: F41.9

## 2010-12-08 HISTORY — DX: Hyperlipidemia, unspecified: E78.5

## 2010-12-08 MED ORDER — BUTALBITAL-APAP-CAFFEINE 50-325-40 MG PO TABS: 1.0000 | ORAL_TABLET | Freq: Four times a day (QID) | ORAL | Status: AC | PRN

## 2010-12-08 MED ORDER — DEXAMETHASONE SODIUM PHOSPHATE 4 MG/ML IJ SOLN
10.0000 mg | Freq: Once | INTRAMUSCULAR | Status: AC
  Administered 2010-12-08: 10 mg via INTRAVENOUS
  Filled 2010-12-08: qty 3

## 2010-12-08 MED ORDER — KETOROLAC TROMETHAMINE 30 MG/ML IJ SOLN
30.0000 mg | Freq: Once | INTRAMUSCULAR | Status: AC
  Administered 2010-12-08: 30 mg via INTRAVENOUS
  Filled 2010-12-08: qty 1

## 2010-12-08 MED ORDER — SODIUM CHLORIDE 0.9 % IV BOLUS (SEPSIS)
1000.0000 mL | Freq: Once | INTRAVENOUS | Status: AC
  Administered 2010-12-08: 1000 mL via INTRAVENOUS

## 2010-12-08 MED ORDER — DIPHENHYDRAMINE HCL 50 MG/ML IJ SOLN
25.0000 mg | Freq: Once | INTRAMUSCULAR | Status: AC
  Administered 2010-12-08: 25 mg via INTRAVENOUS
  Filled 2010-12-08: qty 1

## 2010-12-08 MED ORDER — MORPHINE SULFATE 4 MG/ML IJ SOLN
4.0000 mg | Freq: Once | INTRAMUSCULAR | Status: AC
  Administered 2010-12-08: 4 mg via INTRAVENOUS
  Filled 2010-12-08: qty 1

## 2010-12-08 MED ORDER — METOCLOPRAMIDE HCL 5 MG/ML IJ SOLN
10.0000 mg | Freq: Once | INTRAMUSCULAR | Status: AC
  Administered 2010-12-08: 10 mg via INTRAVENOUS
  Filled 2010-12-08: qty 2

## 2010-12-08 NOTE — ED Notes (Signed)
Dr. Hunt at bedside.
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
## 2010-12-08 NOTE — ED Notes (Signed)
Pt c/o migraine headache and tooth pain. Pt states her head started hurting 2 days ago. Pt states she chipped her tooth a month ago.

## 2010-12-08 NOTE — ED Notes (Signed)
Pt reports some improvement in headache, but that she does still have some pain.  No distress noted at this time.  IV site benign.  Pt medicated.  Will monitor for a short time before discharging.

## 2010-12-24 ENCOUNTER — Encounter (HOSPITAL_COMMUNITY): Payer: Self-pay | Admitting: *Deleted

## 2010-12-24 ENCOUNTER — Emergency Department (HOSPITAL_COMMUNITY)
Admission: EM | Admit: 2010-12-24 | Discharge: 2010-12-24 | Disposition: A | Discharge status: Home / Self Care | Payer: Self-pay | Attending: Emergency Medicine | Admitting: Emergency Medicine

## 2010-12-24 DIAGNOSIS — F17200 Nicotine dependence, unspecified, uncomplicated: Secondary | ICD-10-CM | POA: Insufficient documentation

## 2010-12-24 DIAGNOSIS — K029 Dental caries, unspecified: Secondary | ICD-10-CM | POA: Insufficient documentation

## 2010-12-24 DIAGNOSIS — I10 Essential (primary) hypertension: Secondary | ICD-10-CM | POA: Insufficient documentation

## 2010-12-24 DIAGNOSIS — K219 Gastro-esophageal reflux disease without esophagitis: Secondary | ICD-10-CM | POA: Insufficient documentation

## 2010-12-24 DIAGNOSIS — B182 Chronic viral hepatitis C: Secondary | ICD-10-CM | POA: Insufficient documentation

## 2010-12-24 DIAGNOSIS — E785 Hyperlipidemia, unspecified: Secondary | ICD-10-CM | POA: Insufficient documentation

## 2010-12-24 MED ORDER — PENICILLIN V POTASSIUM 500 MG PO TABS: 500.0000 mg | ORAL_TABLET | Freq: Four times a day (QID) | ORAL | Status: AC

## 2010-12-24 MED ORDER — OXYCODONE-ACETAMINOPHEN 5-325 MG PO TABS: 1.0000 | ORAL_TABLET | ORAL | Status: AC | PRN

## 2010-12-24 MED ORDER — PENICILLIN V POTASSIUM 250 MG PO TABS
500.0000 mg | ORAL_TABLET | Freq: Once | ORAL | Status: AC
  Administered 2010-12-24: 500 mg via ORAL
  Filled 2010-12-24: qty 2

## 2010-12-24 MED ORDER — OXYCODONE-ACETAMINOPHEN 5-325 MG PO TABS: 1.0000 | ORAL_TABLET | Freq: Once | ORAL | Status: DC

## 2010-12-24 NOTE — ED Notes (Signed)
Dental pain. 
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
## 2010-12-24 NOTE — ED Notes (Signed)
Pt sleeping and snoring  during exam, difficult to arouse.  States has not taken anything for pain since noon. slurring her words. Some swelling noted in lower rt jaw line. Pt states has no dentist before falling asleep again. PA-C notified of pts condition.

## 2010-12-28 ENCOUNTER — Encounter (HOSPITAL_COMMUNITY): Payer: Self-pay | Admitting: Emergency Medicine

## 2010-12-28 ENCOUNTER — Emergency Department (HOSPITAL_COMMUNITY)
Admission: EM | Admit: 2010-12-28 | Discharge: 2010-12-28 | Disposition: A | Discharge status: Home / Self Care | Payer: Self-pay | Attending: Emergency Medicine | Admitting: Emergency Medicine

## 2010-12-28 DIAGNOSIS — K739 Chronic hepatitis, unspecified: Secondary | ICD-10-CM | POA: Insufficient documentation

## 2010-12-28 DIAGNOSIS — K219 Gastro-esophageal reflux disease without esophagitis: Secondary | ICD-10-CM | POA: Insufficient documentation

## 2010-12-28 DIAGNOSIS — E785 Hyperlipidemia, unspecified: Secondary | ICD-10-CM | POA: Insufficient documentation

## 2010-12-28 DIAGNOSIS — K089 Disorder of teeth and supporting structures, unspecified: Secondary | ICD-10-CM | POA: Insufficient documentation

## 2010-12-28 DIAGNOSIS — K0889 Other specified disorders of teeth and supporting structures: Secondary | ICD-10-CM

## 2010-12-28 DIAGNOSIS — I10 Essential (primary) hypertension: Secondary | ICD-10-CM | POA: Insufficient documentation

## 2010-12-28 DIAGNOSIS — F17200 Nicotine dependence, unspecified, uncomplicated: Secondary | ICD-10-CM | POA: Insufficient documentation

## 2010-12-28 MED ORDER — OXYCODONE HCL 5 MG PO TABS: ORAL_TABLET | ORAL | Status: DC

## 2010-12-28 MED ORDER — CLINDAMYCIN HCL 150 MG PO CAPS: 150300.0000 mg | ORAL_CAPSULE | Freq: Three times a day (TID) | ORAL | Status: AC

## 2010-12-28 MED ORDER — CLINDAMYCIN HCL 150 MG PO CAPS
300.0000 mg | ORAL_CAPSULE | Freq: Once | ORAL | Status: AC
  Administered 2010-12-28: 300 mg via ORAL
  Filled 2010-12-28: qty 2

## 2010-12-28 MED ORDER — OXYCODONE-ACETAMINOPHEN 5-325 MG PO TABS
1.0000 | ORAL_TABLET | Freq: Once | ORAL | Status: AC
  Administered 2010-12-28: 1 via ORAL
  Filled 2010-12-28: qty 1

## 2010-12-28 MED ORDER — IBUPROFEN 800 MG PO TABS
800.0000 mg | ORAL_TABLET | Freq: Once | ORAL | Status: AC
  Administered 2010-12-28: 800 mg via ORAL
  Filled 2010-12-28: qty 1
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
## 2010-12-28 NOTE — ED Notes (Signed)
Here for toothache. States she was here on Monday for same.

## 2010-12-31 NOTE — ED Provider Notes (Signed)
Medical screening examination/treatment/procedure(s) were performed by non-physician practitioner and as supervising physician I was immediately available for consultation/collaboration.  [name], MD 12/31/10 [phone]

## 2010-12-31 NOTE — ED Provider Notes (Signed)
Medical screening examination/treatment/procedure(s) were performed by non-physician practitioner and as supervising physician I was immediately available for consultation/collaboration.   Sharad Vaneaton M Tristian Bouska, MD 12/31/10 0027 

## 2011-06-27 ENCOUNTER — Encounter (HOSPITAL_COMMUNITY): Payer: Self-pay

## 2011-06-27 ENCOUNTER — Emergency Department (HOSPITAL_COMMUNITY)
Admission: EM | Admit: 2011-06-27 | Discharge: 2011-06-27 | Disposition: A | Discharge status: Home / Self Care | Payer: Self-pay | Attending: Emergency Medicine | Admitting: Emergency Medicine

## 2011-06-27 DIAGNOSIS — R059 Cough, unspecified: Secondary | ICD-10-CM | POA: Insufficient documentation

## 2011-06-27 DIAGNOSIS — IMO0001 Reserved for inherently not codable concepts without codable children: Secondary | ICD-10-CM | POA: Insufficient documentation

## 2011-06-27 DIAGNOSIS — F17200 Nicotine dependence, unspecified, uncomplicated: Secondary | ICD-10-CM | POA: Insufficient documentation

## 2011-06-27 DIAGNOSIS — R05 Cough: Secondary | ICD-10-CM | POA: Insufficient documentation

## 2011-06-27 DIAGNOSIS — E785 Hyperlipidemia, unspecified: Secondary | ICD-10-CM | POA: Insufficient documentation

## 2011-06-27 DIAGNOSIS — R6883 Chills (without fever): Secondary | ICD-10-CM | POA: Insufficient documentation

## 2011-06-27 DIAGNOSIS — F411 Generalized anxiety disorder: Secondary | ICD-10-CM | POA: Insufficient documentation

## 2011-06-27 DIAGNOSIS — J069 Acute upper respiratory infection, unspecified: Secondary | ICD-10-CM | POA: Insufficient documentation

## 2011-06-27 DIAGNOSIS — J3489 Other specified disorders of nose and nasal sinuses: Secondary | ICD-10-CM | POA: Insufficient documentation

## 2011-06-27 DIAGNOSIS — H9209 Otalgia, unspecified ear: Secondary | ICD-10-CM | POA: Insufficient documentation

## 2011-06-27 DIAGNOSIS — R51 Headache: Secondary | ICD-10-CM | POA: Insufficient documentation

## 2011-06-27 DIAGNOSIS — K219 Gastro-esophageal reflux disease without esophagitis: Secondary | ICD-10-CM | POA: Insufficient documentation

## 2011-06-27 DIAGNOSIS — M549 Dorsalgia, unspecified: Secondary | ICD-10-CM | POA: Insufficient documentation

## 2011-06-27 DIAGNOSIS — Z8619 Personal history of other infectious and parasitic diseases: Secondary | ICD-10-CM | POA: Insufficient documentation

## 2011-06-27 DIAGNOSIS — I10 Essential (primary) hypertension: Secondary | ICD-10-CM | POA: Insufficient documentation

## 2011-06-27 DIAGNOSIS — Z79899 Other long term (current) drug therapy: Secondary | ICD-10-CM | POA: Insufficient documentation

## 2011-06-27 MED ORDER — HYDROCODONE-ACETAMINOPHEN 5-325 MG PO TABS: 1.0000 | ORAL_TABLET | ORAL | Status: DC | PRN

## 2011-06-27 MED ORDER — AZITHROMYCIN 250 MG PO TABS: 250.0000 mg | ORAL_TABLET | Freq: Every day | ORAL | Status: DC

## 2011-06-27 MED ORDER — HYDROCODONE-ACETAMINOPHEN 5-325 MG PO TABS: 1.0000 | ORAL_TABLET | ORAL | Status: AC | PRN

## 2011-06-27 MED ORDER — OXYMETAZOLINE HCL 0.05 % NA SOLN
1.0000 | Freq: Once | NASAL | Status: AC
  Administered 2011-06-27: 1 via NASAL
  Filled 2011-06-27: qty 15

## 2011-06-27 MED ORDER — ONDANSETRON HCL 4 MG/2ML IJ SOLN
4.0000 mg | Freq: Once | INTRAMUSCULAR | Status: AC
  Administered 2011-06-27: 4 mg via INTRAVENOUS
  Filled 2011-06-27: qty 2

## 2011-06-27 MED ORDER — PROMETHAZINE HCL 25 MG PO TABS: 12.5000 mg | ORAL_TABLET | Freq: Four times a day (QID) | ORAL | Status: AC | PRN

## 2011-06-27 MED ORDER — AZITHROMYCIN 250 MG PO TABS: 250.0000 mg | ORAL_TABLET | Freq: Every day | ORAL | Status: AC

## 2011-06-27 MED ORDER — KETOROLAC TROMETHAMINE 30 MG/ML IJ SOLN
30.0000 mg | Freq: Once | INTRAMUSCULAR | Status: AC
  Administered 2011-06-27: 30 mg via INTRAVENOUS
  Filled 2011-06-27: qty 1

## 2011-06-27 MED ORDER — PROMETHAZINE HCL 25 MG PO TABS: 12.5000 mg | ORAL_TABLET | Freq: Four times a day (QID) | ORAL | Status: DC | PRN

## 2011-06-27 MED ORDER — MORPHINE SULFATE 2 MG/ML IJ SOLN
2.0000 mg | Freq: Once | INTRAMUSCULAR | Status: AC
  Administered 2011-06-27: 2 mg via INTRAVENOUS
  Filled 2011-06-27: qty 1

## 2011-06-27 MED ORDER — SODIUM CHLORIDE 0.9 % IV BOLUS (SEPSIS)
500.0000 mL | Freq: Once | INTRAVENOUS | Status: AC
  Administered 2011-06-27: 17:00:00 via INTRAVENOUS
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
## 2011-06-27 NOTE — Discharge Instructions (Signed)
Drink lots of fluids. Use tylenol or ibuprofen for the aches, pains and any fevers. Take all of the antibiotic. Use the pain and nausea medicine as needed. Follow up with your doctor.

## 2011-06-27 NOTE — ED Notes (Signed)
Pt complaint of slight itching at IV site and right arm once given morphine. Morphine was administered over a 2 minute period.

## 2011-06-27 NOTE — ED Notes (Signed)
Pt presents with "typical" migraine, back pain, and left ear pain x 2 days. Pt also states she stays nauseated but vomits in the AM.

## 2011-06-27 NOTE — ED Notes (Signed)
Pt stated she had a really bad headache, but mostly posteriorly. Her left ear hurts worse than her right. Lung sounds were clear on auscultation, but she did have a cough. She stated that she voided a few minutes before 1630 and her last bowel movement was in the morning. She has no other areas of pain, no swelling, and no other notable changes when asked.

## 2011-06-27 NOTE — ED Notes (Addendum)
Pt requested more pain medication. She said her pain level is a 4 or 5. MD notified.

## 2011-09-20 ENCOUNTER — Encounter (HOSPITAL_COMMUNITY): Payer: Self-pay | Admitting: Emergency Medicine

## 2011-09-20 ENCOUNTER — Emergency Department (HOSPITAL_COMMUNITY)
Admission: EM | Admit: 2011-09-20 | Discharge: 2011-09-20 | Disposition: A | Discharge status: Home / Self Care | Payer: Self-pay | Attending: Emergency Medicine | Admitting: Emergency Medicine

## 2011-09-20 DIAGNOSIS — K029 Dental caries, unspecified: Secondary | ICD-10-CM | POA: Insufficient documentation

## 2011-09-20 DIAGNOSIS — I10 Essential (primary) hypertension: Secondary | ICD-10-CM | POA: Insufficient documentation

## 2011-09-20 DIAGNOSIS — Z79899 Other long term (current) drug therapy: Secondary | ICD-10-CM | POA: Insufficient documentation

## 2011-09-20 DIAGNOSIS — K219 Gastro-esophageal reflux disease without esophagitis: Secondary | ICD-10-CM | POA: Insufficient documentation

## 2011-09-20 DIAGNOSIS — K0889 Other specified disorders of teeth and supporting structures: Secondary | ICD-10-CM

## 2011-09-20 DIAGNOSIS — B1920 Unspecified viral hepatitis C without hepatic coma: Secondary | ICD-10-CM | POA: Insufficient documentation

## 2011-09-20 DIAGNOSIS — E785 Hyperlipidemia, unspecified: Secondary | ICD-10-CM | POA: Insufficient documentation

## 2011-09-20 DIAGNOSIS — R51 Headache: Secondary | ICD-10-CM

## 2011-09-20 DIAGNOSIS — F17200 Nicotine dependence, unspecified, uncomplicated: Secondary | ICD-10-CM | POA: Insufficient documentation

## 2011-09-20 MED ORDER — HYDROCODONE-ACETAMINOPHEN 5-325 MG PO TABS: ORAL_TABLET | ORAL | Status: AC

## 2011-09-20 MED ORDER — KETOROLAC TROMETHAMINE 60 MG/2ML IM SOLN
60.0000 mg | Freq: Once | INTRAMUSCULAR | Status: AC
  Administered 2011-09-20: 60 mg via INTRAMUSCULAR
  Filled 2011-09-20: qty 2

## 2011-09-20 MED ORDER — DIPHENHYDRAMINE HCL 25 MG PO CAPS
25.0000 mg | ORAL_CAPSULE | Freq: Once | ORAL | Status: AC
  Administered 2011-09-20: 25 mg via ORAL
  Filled 2011-09-20: qty 1

## 2011-09-20 MED ORDER — METOCLOPRAMIDE HCL 5 MG/ML IJ SOLN
10.0000 mg | Freq: Once | INTRAMUSCULAR | Status: AC
  Administered 2011-09-20: 10 mg via INTRAMUSCULAR
  Filled 2011-09-20: qty 2

## 2011-09-20 MED ORDER — PENICILLIN V POTASSIUM 500 MG PO TABS: 500.0000 mg | ORAL_TABLET | Freq: Four times a day (QID) | ORAL | Status: AC

## 2011-09-20 NOTE — ED Notes (Signed)
Pt c/o dental pain and headache x 2 days with nausea.
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
## 2011-09-20 NOTE — ED Notes (Signed)
Pt presents after breaking a tooth 2 days prior to visit. Pt states rt lower tooth is painful with increasing pain. Pt also presents with a headache x 2 days with light sensitivity and nausea. Pt reports taking Fioricet for heads with good success, however is currently out of medication. Pt denies weakness and vision changes, NAD noted.

## 2011-09-20 NOTE — Discharge Instructions (Signed)
Dental Pain  A tooth ache may be caused by cavities (tooth decay). Cavities expose the nerve of the tooth to air and hot or cold temperatures. It may come from an infection or abscess (also called a boil or furuncle) around your tooth. It is also often caused by dental caries (tooth decay). This causes the pain you are having.  DIAGNOSIS   Your caregiver can diagnose this problem by exam.  TREATMENT   · If caused by an infection, it may be treated with medications which kill germs (antibiotics) and pain medications as prescribed by your caregiver. Take medications as directed.  · Only take over-the-counter or prescription medicines for pain, discomfort, or fever as directed by your caregiver.  · Whether the tooth ache today is caused by infection or dental disease, you should see your dentist as soon as possible for further care.  SEEK MEDICAL CARE IF:  The exam and treatment you received today has been provided on an emergency basis only. This is not a substitute for complete medical or dental care. If your problem worsens or new problems (symptoms) appear, and you are unable to meet with your dentist, call or return to this location.  SEEK IMMEDIATE MEDICAL CARE IF:   · You have a fever.  · You develop redness and swelling of your face, jaw, or neck.  · You are unable to open your mouth.  · You have severe pain uncontrolled by pain medicine.  MAKE SURE YOU:   · Understand these instructions.  · Will watch your condition.  · Will get help right away if you are not doing well or get worse.  Document Released: 04/07/2005 Document Revised: 03/27/2011 Document Reviewed: 11/24/2007  [employer]® Patient Information ©2012 [employer].

## 2011-09-26 NOTE — ED Provider Notes (Signed)
Medical screening examination/treatment/procedure(s) were performed by non-physician practitioner and as supervising physician I was immediately available for consultation/collaboration.  Shelda Jakes, MD 09/26/11 [phone]

## 2012-02-03 ENCOUNTER — Emergency Department (HOSPITAL_COMMUNITY)
Admission: EM | Admit: 2012-02-03 | Discharge: 2012-02-03 | Disposition: A | Discharge status: Home / Self Care | Payer: Self-pay | Attending: Emergency Medicine | Admitting: Emergency Medicine

## 2012-02-03 ENCOUNTER — Encounter (HOSPITAL_COMMUNITY): Payer: Self-pay | Admitting: *Deleted

## 2012-02-03 DIAGNOSIS — E785 Hyperlipidemia, unspecified: Secondary | ICD-10-CM | POA: Insufficient documentation

## 2012-02-03 DIAGNOSIS — R51 Headache: Secondary | ICD-10-CM

## 2012-02-03 DIAGNOSIS — K029 Dental caries, unspecified: Secondary | ICD-10-CM | POA: Insufficient documentation

## 2012-02-03 DIAGNOSIS — K0889 Other specified disorders of teeth and supporting structures: Secondary | ICD-10-CM

## 2012-02-03 DIAGNOSIS — F17200 Nicotine dependence, unspecified, uncomplicated: Secondary | ICD-10-CM | POA: Insufficient documentation

## 2012-02-03 DIAGNOSIS — F411 Generalized anxiety disorder: Secondary | ICD-10-CM | POA: Insufficient documentation

## 2012-02-03 DIAGNOSIS — K219 Gastro-esophageal reflux disease without esophagitis: Secondary | ICD-10-CM | POA: Insufficient documentation

## 2012-02-03 DIAGNOSIS — I10 Essential (primary) hypertension: Secondary | ICD-10-CM | POA: Insufficient documentation

## 2012-02-03 MED ORDER — AMOXICILLIN 500 MG PO CAPS: 500.0000 mg | ORAL_CAPSULE | Freq: Three times a day (TID) | ORAL | Status: DC

## 2012-02-03 MED ORDER — DICLOFENAC SODIUM 75 MG PO TBEC: 75.0000 mg | DELAYED_RELEASE_TABLET | Freq: Two times a day (BID) | ORAL | Status: DC

## 2012-02-03 MED ORDER — AMOXICILLIN 250 MG PO CAPS
500.0000 mg | ORAL_CAPSULE | Freq: Once | ORAL | Status: AC
  Administered 2012-02-03: 500 mg via ORAL
  Filled 2012-02-03: qty 2

## 2012-02-03 MED ORDER — HYDROCODONE-ACETAMINOPHEN 7.5-325 MG PO TABS: 1.0000 | ORAL_TABLET | ORAL | Status: DC | PRN

## 2012-02-03 MED ORDER — KETOROLAC TROMETHAMINE 10 MG PO TABS
10.0000 mg | ORAL_TABLET | Freq: Once | ORAL | Status: AC
  Administered 2012-02-03: 10 mg via ORAL
  Filled 2012-02-03: qty 1

## 2012-02-03 MED ORDER — HYDROCODONE-ACETAMINOPHEN 5-325 MG PO TABS
2.0000 | ORAL_TABLET | Freq: Once | ORAL | Status: AC
  Administered 2012-02-03: 2 via ORAL
  Filled 2012-02-03: qty 2

## 2012-02-03 MED ORDER — PROMETHAZINE HCL 12.5 MG PO TABS
12.5000 mg | ORAL_TABLET | Freq: Once | ORAL | Status: AC
  Administered 2012-02-03: 12.5 mg via ORAL
  Filled 2012-02-03: qty 1
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
## 2012-02-03 NOTE — ED Notes (Signed)
Dental pain mandibular bicuspid, that has dental caries.  Headache and back pain also.

## 2012-02-03 NOTE — ED Notes (Signed)
Dental pain for the past 2 days with headache

## 2012-02-04 NOTE — ED Provider Notes (Signed)
Medical screening examination/treatment/procedure(s) were performed by non-physician practitioner and as supervising physician I was immediately available for consultation/collaboration.   Benny Lennert, MD 02/04/12 [phone]

## 2012-04-06 ENCOUNTER — Encounter (HOSPITAL_COMMUNITY): Payer: Self-pay

## 2012-04-06 ENCOUNTER — Emergency Department (HOSPITAL_COMMUNITY)
Admission: EM | Admit: 2012-04-06 | Discharge: 2012-04-06 | Disposition: A | Discharge status: Home / Self Care | Payer: Self-pay | Attending: Emergency Medicine | Admitting: Emergency Medicine

## 2012-04-06 DIAGNOSIS — F17200 Nicotine dependence, unspecified, uncomplicated: Secondary | ICD-10-CM | POA: Insufficient documentation

## 2012-04-06 DIAGNOSIS — I10 Essential (primary) hypertension: Secondary | ICD-10-CM | POA: Insufficient documentation

## 2012-04-06 DIAGNOSIS — K089 Disorder of teeth and supporting structures, unspecified: Secondary | ICD-10-CM | POA: Insufficient documentation

## 2012-04-06 DIAGNOSIS — R059 Cough, unspecified: Secondary | ICD-10-CM | POA: Insufficient documentation

## 2012-04-06 DIAGNOSIS — Z8719 Personal history of other diseases of the digestive system: Secondary | ICD-10-CM | POA: Insufficient documentation

## 2012-04-06 DIAGNOSIS — R5383 Other fatigue: Secondary | ICD-10-CM | POA: Insufficient documentation

## 2012-04-06 DIAGNOSIS — J029 Acute pharyngitis, unspecified: Secondary | ICD-10-CM | POA: Insufficient documentation

## 2012-04-06 DIAGNOSIS — F411 Generalized anxiety disorder: Secondary | ICD-10-CM | POA: Insufficient documentation

## 2012-04-06 DIAGNOSIS — Z79899 Other long term (current) drug therapy: Secondary | ICD-10-CM | POA: Insufficient documentation

## 2012-04-06 DIAGNOSIS — E785 Hyperlipidemia, unspecified: Secondary | ICD-10-CM | POA: Insufficient documentation

## 2012-04-06 DIAGNOSIS — Z8679 Personal history of other diseases of the circulatory system: Secondary | ICD-10-CM | POA: Insufficient documentation

## 2012-04-06 DIAGNOSIS — R6883 Chills (without fever): Secondary | ICD-10-CM | POA: Insufficient documentation

## 2012-04-06 DIAGNOSIS — IMO0001 Reserved for inherently not codable concepts without codable children: Secondary | ICD-10-CM | POA: Insufficient documentation

## 2012-04-06 DIAGNOSIS — J069 Acute upper respiratory infection, unspecified: Secondary | ICD-10-CM | POA: Insufficient documentation

## 2012-04-06 DIAGNOSIS — R05 Cough: Secondary | ICD-10-CM | POA: Insufficient documentation

## 2012-04-06 DIAGNOSIS — R52 Pain, unspecified: Secondary | ICD-10-CM | POA: Insufficient documentation

## 2012-04-06 DIAGNOSIS — R5381 Other malaise: Secondary | ICD-10-CM | POA: Insufficient documentation

## 2012-04-06 DIAGNOSIS — R6889 Other general symptoms and signs: Secondary | ICD-10-CM | POA: Insufficient documentation

## 2012-04-06 DIAGNOSIS — Z87448 Personal history of other diseases of urinary system: Secondary | ICD-10-CM | POA: Insufficient documentation

## 2012-04-06 DIAGNOSIS — Z8739 Personal history of other diseases of the musculoskeletal system and connective tissue: Secondary | ICD-10-CM | POA: Insufficient documentation

## 2012-04-06 DIAGNOSIS — K0889 Other specified disorders of teeth and supporting structures: Secondary | ICD-10-CM

## 2012-04-06 DIAGNOSIS — R51 Headache: Secondary | ICD-10-CM | POA: Insufficient documentation

## 2012-04-06 MED ORDER — PENICILLIN V POTASSIUM 500 MG PO TABS: 500.0000 mg | ORAL_TABLET | Freq: Four times a day (QID) | ORAL | Status: AC

## 2012-04-06 MED ORDER — BENZONATATE 200 MG PO CAPS: 200.0000 mg | ORAL_CAPSULE | Freq: Three times a day (TID) | ORAL | Status: DC | PRN

## 2012-04-06 MED ORDER — BUTALBITAL-APAP-CAFFEINE 50-325-40 MG PO TABS: 1.0000 | ORAL_TABLET | Freq: Four times a day (QID) | ORAL | Status: DC | PRN

## 2012-04-06 NOTE — ED Notes (Signed)
Patient with no complaints at this time. Respirations even and unlabored. Skin warm/dry. Discharge instructions reviewed with patient at this time. Patient given opportunity to voice concerns/ask questions. Patient discharged at this time and left Emergency Department with steady gait.   

## 2012-04-06 NOTE — ED Notes (Signed)
URI symptoms x 4-5 days.  Also has R lower tooth broken off and painful.  Has tried multiple OTC cold meds w/out relief.

## 2012-04-06 NOTE — ED Notes (Signed)
Pt has multiple complaints.
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
## 2012-04-08 NOTE — ED Provider Notes (Signed)
Medical screening examination/treatment/procedure(s) were performed by non-physician practitioner and as supervising physician I was immediately available for consultation/collaboration.  Kambrey Hagger, MD 04/08/12 0752 

## 2012-08-30 ENCOUNTER — Other Ambulatory Visit (HOSPITAL_COMMUNITY): Payer: Self-pay | Admitting: Family Medicine

## 2012-08-30 DIAGNOSIS — Z139 Encounter for screening, unspecified: Secondary | ICD-10-CM

## 2012-09-03 ENCOUNTER — Ambulatory Visit (HOSPITAL_COMMUNITY): Payer: Self-pay

## 2012-09-03 ENCOUNTER — Other Ambulatory Visit (HOSPITAL_COMMUNITY): Payer: Self-pay | Admitting: Family Medicine

## 2012-09-03 ENCOUNTER — Encounter (HOSPITAL_COMMUNITY): Payer: Self-pay | Admitting: *Deleted

## 2012-09-03 ENCOUNTER — Emergency Department (HOSPITAL_COMMUNITY)
Admission: EM | Admit: 2012-09-03 | Discharge: 2012-09-03 | Discharge status: Against Medical Advice | Payer: BC Managed Care – PPO | Attending: Emergency Medicine | Admitting: Emergency Medicine

## 2012-09-03 ENCOUNTER — Ambulatory Visit (HOSPITAL_COMMUNITY)
Admission: RE | Admit: 2012-09-03 | Discharge: 2012-09-03 | Disposition: A | Discharge status: Home / Self Care | Payer: BC Managed Care – PPO | Source: Ambulatory Visit | Attending: Family Medicine | Admitting: Family Medicine

## 2012-09-03 DIAGNOSIS — F17290 Nicotine dependence, other tobacco product, uncomplicated: Secondary | ICD-10-CM

## 2012-09-03 DIAGNOSIS — R059 Cough, unspecified: Secondary | ICD-10-CM | POA: Insufficient documentation

## 2012-09-03 DIAGNOSIS — K089 Disorder of teeth and supporting structures, unspecified: Secondary | ICD-10-CM | POA: Insufficient documentation

## 2012-09-03 DIAGNOSIS — R51 Headache: Secondary | ICD-10-CM | POA: Insufficient documentation

## 2012-09-03 DIAGNOSIS — R05 Cough: Secondary | ICD-10-CM | POA: Insufficient documentation

## 2012-09-03 DIAGNOSIS — M549 Dorsalgia, unspecified: Secondary | ICD-10-CM | POA: Insufficient documentation

## 2012-09-03 DIAGNOSIS — F17200 Nicotine dependence, unspecified, uncomplicated: Secondary | ICD-10-CM | POA: Insufficient documentation

## 2012-09-03 NOTE — ED Notes (Addendum)
Back pain, headache,  Dental pain  Pt seen by Dr Renard Matter today and given rx for hydrocodone, but lost the prescription Pt has slurring of speech, and unsteady gait

## 2012-09-03 NOTE — ED Notes (Signed)
Pt left treatment area to go outside, was told she needed to stay in [facility] or would have to check back in. Pt left anyway.

## 2012-09-03 NOTE — ED Notes (Signed)
Pt has not returned to [facility]. Will be discharged from system.

## 2012-09-04 ENCOUNTER — Emergency Department (HOSPITAL_COMMUNITY)
Admission: EM | Admit: 2012-09-04 | Discharge: 2012-09-04 | Disposition: A | Discharge status: Home / Self Care | Payer: BC Managed Care – PPO | Attending: Emergency Medicine | Admitting: Emergency Medicine

## 2012-09-04 ENCOUNTER — Encounter (HOSPITAL_COMMUNITY): Payer: Self-pay

## 2012-09-04 DIAGNOSIS — I10 Essential (primary) hypertension: Secondary | ICD-10-CM | POA: Insufficient documentation

## 2012-09-04 DIAGNOSIS — Z87448 Personal history of other diseases of urinary system: Secondary | ICD-10-CM | POA: Insufficient documentation

## 2012-09-04 DIAGNOSIS — F17200 Nicotine dependence, unspecified, uncomplicated: Secondary | ICD-10-CM | POA: Insufficient documentation

## 2012-09-04 DIAGNOSIS — Z760 Encounter for issue of repeat prescription: Secondary | ICD-10-CM | POA: Insufficient documentation

## 2012-09-04 DIAGNOSIS — Z79899 Other long term (current) drug therapy: Secondary | ICD-10-CM | POA: Insufficient documentation

## 2012-09-04 DIAGNOSIS — M545 Low back pain, unspecified: Secondary | ICD-10-CM | POA: Insufficient documentation

## 2012-09-04 DIAGNOSIS — K219 Gastro-esophageal reflux disease without esophagitis: Secondary | ICD-10-CM | POA: Insufficient documentation

## 2012-09-04 DIAGNOSIS — E785 Hyperlipidemia, unspecified: Secondary | ICD-10-CM | POA: Insufficient documentation

## 2012-09-04 DIAGNOSIS — G43909 Migraine, unspecified, not intractable, without status migrainosus: Secondary | ICD-10-CM | POA: Insufficient documentation

## 2012-09-04 DIAGNOSIS — Z8619 Personal history of other infectious and parasitic diseases: Secondary | ICD-10-CM | POA: Insufficient documentation

## 2012-09-04 DIAGNOSIS — G8929 Other chronic pain: Secondary | ICD-10-CM

## 2012-09-04 DIAGNOSIS — F411 Generalized anxiety disorder: Secondary | ICD-10-CM | POA: Insufficient documentation

## 2012-09-04 MED ORDER — ALPRAZOLAM 0.5 MG PO TABS
0.5000 mg | ORAL_TABLET | Freq: Once | ORAL | Status: AC
  Administered 2012-09-04: 0.5 mg via ORAL
  Filled 2012-09-04: qty 1

## 2012-09-04 MED ORDER — OXYCODONE-ACETAMINOPHEN 5-325 MG PO TABS
1.0000 | ORAL_TABLET | Freq: Once | ORAL | Status: AC
  Administered 2012-09-04: 1 via ORAL
  Filled 2012-09-04: qty 1
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
## 2012-09-04 NOTE — ED Notes (Signed)
Pt is now back in the waiting room with her son who has just been discharged and states she is still waiting to be seen for her back pain

## 2012-09-04 NOTE — ED Notes (Signed)
Pt alert & oriented x4, stable gait. Patient given discharge instructions, paperwork & prescription(s). Patient  instructed to stop at the registration desk to finish any additional paperwork. Patient verbalized understanding. Pt left department w/ no further questions. 

## 2012-09-04 NOTE — ED Notes (Signed)
Unable to locate pt for room placement.  Per Chestine Spore, EMT pt not in any of the waiting areas.

## 2012-09-04 NOTE — ED Notes (Signed)
Pt asking for script for percocets till she can see her Dr on Monday.

## 2012-09-04 NOTE — ED Notes (Signed)
Unable to locate pt in all waiting areas for room placement.
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
## 2012-09-04 NOTE — ED Notes (Signed)
Unable to locate pt in all waiting areas 
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
## 2012-09-06 NOTE — ED Provider Notes (Signed)
Medical screening examination/treatment/procedure(s) were performed by non-physician practitioner and as supervising physician I was immediately available for consultation/collaboration.  Riya Huxford S. Lynnelle Mesmer, MD 09/06/12 0654 

## 2012-09-24 ENCOUNTER — Ambulatory Visit (HOSPITAL_COMMUNITY): Payer: BC Managed Care – PPO

## 2012-09-28 ENCOUNTER — Ambulatory Visit (HOSPITAL_COMMUNITY): Payer: BC Managed Care – PPO

## 2012-10-23 DIAGNOSIS — R4182 Altered mental status, unspecified: Secondary | ICD-10-CM

## 2012-12-18 ENCOUNTER — Observation Stay (HOSPITAL_COMMUNITY)
Admission: EM | Admit: 2012-12-18 | Discharge: 2012-12-20 | Disposition: A | Discharge status: Home / Self Care | Payer: BC Managed Care – PPO | Attending: Family Medicine | Admitting: Family Medicine

## 2012-12-18 ENCOUNTER — Emergency Department (HOSPITAL_COMMUNITY): Payer: BC Managed Care – PPO

## 2012-12-18 ENCOUNTER — Encounter (HOSPITAL_COMMUNITY): Payer: Self-pay | Admitting: *Deleted

## 2012-12-18 DIAGNOSIS — S0101XA Laceration without foreign body of scalp, initial encounter: Secondary | ICD-10-CM

## 2012-12-18 DIAGNOSIS — E876 Hypokalemia: Secondary | ICD-10-CM | POA: Insufficient documentation

## 2012-12-18 DIAGNOSIS — R400 Somnolence: Secondary | ICD-10-CM | POA: Diagnosis present

## 2012-12-18 DIAGNOSIS — F17200 Nicotine dependence, unspecified, uncomplicated: Secondary | ICD-10-CM | POA: Insufficient documentation

## 2012-12-18 DIAGNOSIS — M549 Dorsalgia, unspecified: Principal | ICD-10-CM | POA: Insufficient documentation

## 2012-12-18 DIAGNOSIS — K8020 Calculus of gallbladder without cholecystitis without obstruction: Secondary | ICD-10-CM | POA: Insufficient documentation

## 2012-12-18 DIAGNOSIS — Z79899 Other long term (current) drug therapy: Secondary | ICD-10-CM | POA: Insufficient documentation

## 2012-12-18 DIAGNOSIS — F1910 Other psychoactive substance abuse, uncomplicated: Secondary | ICD-10-CM | POA: Diagnosis present

## 2012-12-18 DIAGNOSIS — F1210 Cannabis abuse, uncomplicated: Secondary | ICD-10-CM | POA: Insufficient documentation

## 2012-12-18 DIAGNOSIS — R51 Headache: Secondary | ICD-10-CM | POA: Insufficient documentation

## 2012-12-18 DIAGNOSIS — I959 Hypotension, unspecified: Secondary | ICD-10-CM | POA: Insufficient documentation

## 2012-12-18 DIAGNOSIS — E871 Hypo-osmolality and hyponatremia: Secondary | ICD-10-CM | POA: Insufficient documentation

## 2012-12-18 DIAGNOSIS — Z7982 Long term (current) use of aspirin: Secondary | ICD-10-CM | POA: Insufficient documentation

## 2012-12-18 DIAGNOSIS — R52 Pain, unspecified: Secondary | ICD-10-CM | POA: Insufficient documentation

## 2012-12-18 DIAGNOSIS — R4182 Altered mental status, unspecified: Secondary | ICD-10-CM

## 2012-12-18 DIAGNOSIS — M5137 Other intervertebral disc degeneration, lumbosacral region: Secondary | ICD-10-CM

## 2012-12-18 DIAGNOSIS — R404 Transient alteration of awareness: Secondary | ICD-10-CM | POA: Insufficient documentation

## 2012-12-18 LAB — CBC WITH DIFFERENTIAL/PLATELET
Basophils Absolute: 0.1 10*3/uL (ref 0.0–0.1)
Basophils Relative: 1 % (ref 0–1)
Eosinophils Absolute: 0.2 10*3/uL (ref 0.0–0.7)
Eosinophils Relative: 6 % — ABNORMAL HIGH (ref 0–5)
HCT: 34.8 % — ABNORMAL LOW (ref 36.0–46.0)
Hemoglobin: 12.3 g/dL (ref 12.0–15.0)
Lymphocytes Relative: 49 % — ABNORMAL HIGH (ref 12–46)
Lymphs Abs: 2.1 10*3/uL (ref 0.7–4.0)
MCH: 31.5 pg (ref 26.0–34.0)
MCHC: 35.3 g/dL (ref 30.0–36.0)
MCV: 89.2 fL (ref 78.0–100.0)
Monocytes Absolute: 0.4 10*3/uL (ref 0.1–1.0)
Monocytes Relative: 9 % (ref 3–12)
Neutro Abs: 1.6 10*3/uL — ABNORMAL LOW (ref 1.7–7.7)
Neutrophils Relative %: 36 % — ABNORMAL LOW (ref 43–77)
Platelets: 244 10*3/uL (ref 150–400)
RBC: 3.9 MIL/uL (ref 3.87–5.11)
RDW: 13.9 % (ref 11.5–15.5)
WBC: 4.4 10*3/uL (ref 4.0–10.5)

## 2012-12-18 LAB — RAPID URINE DRUG SCREEN, HOSP PERFORMED
Amphetamines: NOT DETECTED
Barbiturates: POSITIVE — AB
Benzodiazepines: POSITIVE — AB
Cocaine: NOT DETECTED
Opiates: NOT DETECTED
Tetrahydrocannabinol: POSITIVE — AB

## 2012-12-18 LAB — COMPREHENSIVE METABOLIC PANEL
ALT: 13 U/L (ref 0–35)
AST: 15 U/L (ref 0–37)
Albumin: 3.7 g/dL (ref 3.5–5.2)
Alkaline Phosphatase: 98 U/L (ref 39–117)
BUN: 13 mg/dL (ref 6–23)
CO2: 29 mEq/L (ref 19–32)
Calcium: 9.5 mg/dL (ref 8.4–10.5)
Chloride: 91 mEq/L — ABNORMAL LOW (ref 96–112)
Creatinine, Ser: 0.78 mg/dL (ref 0.50–1.10)
GFR calc Af Amer: >90 mL/min (ref >90)
GFR calc non Af Amer: >90 mL/min (ref >90)
Glucose, Bld: 94 mg/dL (ref 70–99)
Potassium: 2.8 mEq/L — ABNORMAL LOW (ref 3.5–5.1)
Sodium: 127 mEq/L — ABNORMAL LOW (ref 135–145)
Total Bilirubin: 0.2 mg/dL — ABNORMAL LOW (ref 0.3–1.2)
Total Protein: 7.1 g/dL (ref 6.0–8.3)

## 2012-12-18 LAB — URINALYSIS, ROUTINE W REFLEX MICROSCOPIC
Bilirubin Urine: NEGATIVE
Glucose, UA: NEGATIVE mg/dL
Hgb urine dipstick: NEGATIVE
Ketones, ur: NEGATIVE mg/dL
Leukocytes, UA: NEGATIVE
Nitrite: NEGATIVE
Protein, ur: NEGATIVE mg/dL
Specific Gravity, Urine: 1.01 (ref 1.005–1.030)
Urobilinogen, UA: 0.2 mg/dL (ref 0.0–1.0)
pH: 6.5 (ref 5.0–8.0)

## 2012-12-18 LAB — ETHANOL: Alcohol, Ethyl (B): <11 mg/dL (ref 0–11)

## 2012-12-18 MED ORDER — SODIUM CHLORIDE 0.9 % IV BOLUS (SEPSIS)
1000.0000 mL | Freq: Once | INTRAVENOUS | Status: AC
  Administered 2012-12-18: 1000 mL via INTRAVENOUS

## 2012-12-18 MED ORDER — SODIUM CHLORIDE 0.9 % IV SOLN
Freq: Once | INTRAVENOUS | Status: AC
  Administered 2012-12-18: 1000 mL via INTRAVENOUS

## 2012-12-18 MED ORDER — ONDANSETRON HCL 4 MG/2ML IJ SOLN
4.0000 mg | Freq: Once | INTRAMUSCULAR | Status: AC
  Administered 2012-12-18: 4 mg via INTRAVENOUS

## 2012-12-18 MED ORDER — NALOXONE HCL 1 MG/ML IJ SOLN
1.0000 mg | Freq: Once | INTRAMUSCULAR | Status: AC
  Administered 2012-12-18: 1 mg via INTRAVENOUS
  Filled 2012-12-18: qty 2

## 2012-12-18 MED ORDER — POTASSIUM CHLORIDE 10 MEQ/100ML IV SOLN
10.0000 meq | Freq: Once | INTRAVENOUS | Status: AC
  Administered 2012-12-18: 10 meq via INTRAVENOUS

## 2012-12-18 MED ORDER — POTASSIUM CHLORIDE 10 MEQ/100ML IV SOLN
INTRAVENOUS | Status: AC
  Filled 2012-12-18: qty 100
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
## 2012-12-18 NOTE — ED Notes (Signed)
Took small sip water and back to sleep. BP dropped, Dr.Miller aware. 2nd liter NS up w/o for bolus

## 2012-12-18 NOTE — ED Notes (Signed)
Speech still thick and hard to understand. Pt c/o headache and back ache. Unable to quantify 10 scale. Back to sleep immed after urinating.

## 2012-12-18 NOTE — ED Notes (Signed)
C/o nausea 

## 2012-12-18 NOTE — ED Notes (Signed)
sleeping

## 2012-12-19 ENCOUNTER — Encounter (HOSPITAL_COMMUNITY): Payer: Self-pay | Admitting: *Deleted

## 2012-12-19 DIAGNOSIS — E876 Hypokalemia: Secondary | ICD-10-CM | POA: Diagnosis present

## 2012-12-19 DIAGNOSIS — R400 Somnolence: Secondary | ICD-10-CM | POA: Diagnosis present

## 2012-12-19 DIAGNOSIS — M549 Dorsalgia, unspecified: Secondary | ICD-10-CM

## 2012-12-19 DIAGNOSIS — E871 Hypo-osmolality and hyponatremia: Secondary | ICD-10-CM

## 2012-12-19 DIAGNOSIS — M5137 Other intervertebral disc degeneration, lumbosacral region: Secondary | ICD-10-CM

## 2012-12-19 DIAGNOSIS — F1910 Other psychoactive substance abuse, uncomplicated: Secondary | ICD-10-CM | POA: Diagnosis present

## 2012-12-19 DIAGNOSIS — I959 Hypotension, unspecified: Secondary | ICD-10-CM

## 2012-12-19 DIAGNOSIS — R4182 Altered mental status, unspecified: Secondary | ICD-10-CM

## 2012-12-19 LAB — COMPREHENSIVE METABOLIC PANEL
ALT: 11 U/L (ref 0–35)
AST: 12 U/L (ref 0–37)
Albumin: 3.1 g/dL — ABNORMAL LOW (ref 3.5–5.2)
Alkaline Phosphatase: 83 U/L (ref 39–117)
BUN: 8 mg/dL (ref 6–23)
CO2: 25 mEq/L (ref 19–32)
Calcium: 8.6 mg/dL (ref 8.4–10.5)
Chloride: 101 mEq/L (ref 96–112)
Creatinine, Ser: 0.61 mg/dL (ref 0.50–1.10)
GFR calc Af Amer: >90 mL/min (ref >90)
GFR calc non Af Amer: >90 mL/min (ref >90)
Glucose, Bld: 79 mg/dL (ref 70–99)
Potassium: 3.5 mEq/L (ref 3.5–5.1)
Sodium: 133 mEq/L — ABNORMAL LOW (ref 135–145)
Total Bilirubin: <0.1 mg/dL — ABNORMAL LOW (ref 0.3–1.2)
Total Protein: 5.8 g/dL — ABNORMAL LOW (ref 6.0–8.3)

## 2012-12-19 LAB — CBC
HCT: 33.1 % — ABNORMAL LOW (ref 36.0–46.0)
Hemoglobin: 11.3 g/dL — ABNORMAL LOW (ref 12.0–15.0)
MCH: 31 pg (ref 26.0–34.0)
MCHC: 34.1 g/dL (ref 30.0–36.0)
MCV: 90.9 fL (ref 78.0–100.0)
Platelets: 217 10*3/uL (ref 150–400)
RBC: 3.64 MIL/uL — ABNORMAL LOW (ref 3.87–5.11)
RDW: 13.7 % (ref 11.5–15.5)
WBC: 5.4 10*3/uL (ref 4.0–10.5)

## 2012-12-19 LAB — LACTIC ACID, PLASMA: Lactic Acid, Venous: 0.4 mmol/L — ABNORMAL LOW (ref 0.5–2.2)

## 2012-12-19 LAB — MRSA PCR SCREENING: MRSA by PCR: POSITIVE — AB

## 2012-12-19 LAB — OSMOLALITY: Osmolality: 275 mOsm/kg (ref 275–300)

## 2012-12-19 MED ORDER — ACETAMINOPHEN 650 MG RE SUPP: 650.0000 mg | Freq: Four times a day (QID) | RECTAL | Status: DC | PRN

## 2012-12-19 MED ORDER — SODIUM CHLORIDE 0.9 % IV SOLN
INTRAVENOUS | Status: DC
  Administered 2012-12-19 – 2012-12-20 (×3): via INTRAVENOUS

## 2012-12-19 MED ORDER — ONDANSETRON HCL 4 MG/2ML IJ SOLN
4.0000 mg | Freq: Three times a day (TID) | INTRAMUSCULAR | Status: AC | PRN
  Administered 2012-12-19: 4 mg via INTRAVENOUS
  Filled 2012-12-19: qty 2

## 2012-12-19 MED ORDER — MUPIROCIN 2 % EX OINT
1.0000 "application " | TOPICAL_OINTMENT | Freq: Two times a day (BID) | CUTANEOUS | Status: DC
  Administered 2012-12-19 – 2012-12-20 (×3): 1 via NASAL
  Filled 2012-12-19: qty 22

## 2012-12-19 MED ORDER — PANTOPRAZOLE SODIUM 40 MG IV SOLR
40.0000 mg | INTRAVENOUS | Status: DC
  Administered 2012-12-19: 40 mg via INTRAVENOUS
  Filled 2012-12-19: qty 40

## 2012-12-19 MED ORDER — SODIUM CHLORIDE 0.9 % IV SOLN: INTRAVENOUS | Status: AC

## 2012-12-19 MED ORDER — POTASSIUM CHLORIDE 10 MEQ/100ML IV SOLN
10.0000 meq | INTRAVENOUS | Status: AC
  Administered 2012-12-19 (×3): 10 meq via INTRAVENOUS
  Filled 2012-12-19: qty 100
  Filled 2012-12-19: qty 200

## 2012-12-19 MED ORDER — IBUPROFEN 400 MG PO TABS
400.0000 mg | ORAL_TABLET | Freq: Once | ORAL | Status: AC
  Administered 2012-12-19: 400 mg via ORAL
  Filled 2012-12-19: qty 1

## 2012-12-19 MED ORDER — OXYCODONE-ACETAMINOPHEN 5-325 MG PO TABS
1.0000 | ORAL_TABLET | ORAL | Status: DC | PRN
  Administered 2012-12-19 – 2012-12-20 (×6): 1 via ORAL
  Filled 2012-12-19 (×6): qty 1

## 2012-12-19 MED ORDER — PANTOPRAZOLE SODIUM 40 MG PO TBEC
40.0000 mg | DELAYED_RELEASE_TABLET | Freq: Every day | ORAL | Status: DC
  Administered 2012-12-19: 40 mg via ORAL
  Filled 2012-12-19: qty 1

## 2012-12-19 MED ORDER — CHLORHEXIDINE GLUCONATE CLOTH 2 % EX PADS
6.0000 | MEDICATED_PAD | Freq: Every day | CUTANEOUS | Status: DC
  Administered 2012-12-20: 6 via TOPICAL

## 2012-12-19 MED ORDER — ACETAMINOPHEN 325 MG PO TABS
650.0000 mg | ORAL_TABLET | Freq: Four times a day (QID) | ORAL | Status: DC | PRN
Start: 2012-12-19 — End: 2012-12-20
  Administered 2012-12-19: 650 mg via ORAL
  Filled 2012-12-19: qty 2

## 2012-12-19 NOTE — Progress Notes (Signed)
Per Dr Janna Arch, diet advanced to 2gm Na. Give norco for headache at this time, as fiorecet may cause nausea, per dr.
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
## 2012-12-19 NOTE — Progress Notes (Signed)
Patient requesting fiorcet for headache, states norco given at 1030 did not help. Also requesting that diet be advanced. Dr Janna Arch paged, awaiting return call. Patient transfer to [facility] with one assist.

## 2012-12-19 NOTE — Plan of Care (Signed)
Problem: Consults Goal: General Medical Patient Education See Patient Education Module for specific education. Outcome: Progressing Admitted with ams, patient is drowsy with thick slurred speech.  Tries to respond but incomprehensible speech.  Problem: Phase I Progression Outcomes Goal: Pain controlled with appropriate interventions Outcome: Progressing Complains of pain during care given, but quickly falls back to sleep Goal: OOB as tolerated unless otherwise ordered Outcome: Not Progressing Strict bedrest Goal: Hemodynamically stable Outcome: Progressing Soft blood pressure in  ED, resolving since admission,  Patient has received 2.5 liters of Normal saline prior to admission to floor

## 2012-12-19 NOTE — Progress Notes (Signed)
Patient transferred to room 310. Report given to [facility]. Vital signs stable at transfer.
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
## 2012-12-20 LAB — BASIC METABOLIC PANEL
BUN: 5 mg/dL — ABNORMAL LOW (ref 6–23)
CO2: 25 mEq/L (ref 19–32)
Calcium: 9.3 mg/dL (ref 8.4–10.5)
Chloride: 101 mEq/L (ref 96–112)
Creatinine, Ser: 0.57 mg/dL (ref 0.50–1.10)
GFR calc Af Amer: >90 mL/min (ref >90)
GFR calc non Af Amer: >90 mL/min (ref >90)
Glucose, Bld: 81 mg/dL (ref 70–99)
Potassium: 3.3 mEq/L — ABNORMAL LOW (ref 3.5–5.1)
Sodium: 135 mEq/L (ref 135–145)

## 2012-12-20 MED ORDER — ONDANSETRON HCL 4 MG/2ML IJ SOLN
4.0000 mg | Freq: Once | INTRAMUSCULAR | Status: AC
  Administered 2012-12-20: 4 mg via INTRAVENOUS

## 2012-12-20 MED ORDER — ONDANSETRON HCL 4 MG/2ML IJ SOLN
INTRAMUSCULAR | Status: AC
  Administered 2012-12-20: 4 mg via INTRAVENOUS
  Filled 2012-12-20: qty 2

## 2012-12-20 MED ORDER — POTASSIUM CHLORIDE 10 MEQ/100ML IV SOLN
10.0000 meq | INTRAVENOUS | Status: AC
  Administered 2012-12-20 (×3): 10 meq via INTRAVENOUS
  Filled 2012-12-20 (×3): qty 100

## 2012-12-20 NOTE — Plan of Care (Signed)
Problem: Discharge Progression Outcomes Goal: Other Discharge Outcomes/Goals Outcome: Completed/Met Date Met:  12/20/12 Discharged to home with family
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
## 2012-12-20 NOTE — Discharge Summary (Signed)
[name], [name]                ACCOUNT NO.:  [id number]  MEDICAL RECORD NO.:  [id number]  LOCATION:  A310                          FACILITY:  [facility]  PHYSICIAN:  Ashana Tullo G. Renard Matter, MD   DATE OF BIRTH:  [birth year]  DATE OF ADMISSION:  12/18/2012 DATE OF DISCHARGE:  LH                              DISCHARGE SUMMARY   ADDENDUM:  The patient will be on the following medications at home:  1. Zantac 150 mg twice daily. 2. Aspirin, acetaminophen, caffeine 1 pack daily as needed for     headache. 3. Benadryl 25 mg as needed for allergies. 4. Alka Seltzer Plus Cold and Cough 2 tablets daily as needed. 5. Simvastatin 40 mg daily. 6. Alprazolam 0.5 mg every 4 hours as needed. 7. Fioricet 50/325/40 one tab twice daily as needed for headache. 8. Prinzide and Zestoretic 20/12.5 mg daily. 9. Norco 5/325 every 4 hours for severe pain.     Farhan Jean G. Renard Matter, MD     AGM/MEDQ  D:  12/20/2012  T:  12/20/2012  Job:  [id number]

## 2013-04-19 ENCOUNTER — Encounter (HOSPITAL_COMMUNITY): Payer: Self-pay | Admitting: Emergency Medicine

## 2013-04-19 ENCOUNTER — Emergency Department (HOSPITAL_COMMUNITY)
Admission: EM | Admit: 2013-04-19 | Discharge: 2013-04-19 | Disposition: A | Discharge status: Home / Self Care | Payer: Self-pay | Attending: Emergency Medicine | Admitting: Emergency Medicine

## 2013-04-19 ENCOUNTER — Emergency Department (HOSPITAL_COMMUNITY): Payer: Self-pay

## 2013-04-19 DIAGNOSIS — G8929 Other chronic pain: Secondary | ICD-10-CM | POA: Insufficient documentation

## 2013-04-19 DIAGNOSIS — Z79899 Other long term (current) drug therapy: Secondary | ICD-10-CM | POA: Insufficient documentation

## 2013-04-19 DIAGNOSIS — I10 Essential (primary) hypertension: Secondary | ICD-10-CM | POA: Insufficient documentation

## 2013-04-19 DIAGNOSIS — R300 Dysuria: Secondary | ICD-10-CM | POA: Insufficient documentation

## 2013-04-19 DIAGNOSIS — Z8619 Personal history of other infectious and parasitic diseases: Secondary | ICD-10-CM | POA: Insufficient documentation

## 2013-04-19 DIAGNOSIS — F411 Generalized anxiety disorder: Secondary | ICD-10-CM | POA: Insufficient documentation

## 2013-04-19 DIAGNOSIS — K219 Gastro-esophageal reflux disease without esophagitis: Secondary | ICD-10-CM | POA: Insufficient documentation

## 2013-04-19 DIAGNOSIS — E785 Hyperlipidemia, unspecified: Secondary | ICD-10-CM | POA: Insufficient documentation

## 2013-04-19 DIAGNOSIS — F17200 Nicotine dependence, unspecified, uncomplicated: Secondary | ICD-10-CM | POA: Insufficient documentation

## 2013-04-19 DIAGNOSIS — Z87442 Personal history of urinary calculi: Secondary | ICD-10-CM | POA: Insufficient documentation

## 2013-04-19 DIAGNOSIS — Z87448 Personal history of other diseases of urinary system: Secondary | ICD-10-CM | POA: Insufficient documentation

## 2013-04-19 DIAGNOSIS — M5432 Sciatica, left side: Secondary | ICD-10-CM

## 2013-04-19 DIAGNOSIS — IMO0002 Reserved for concepts with insufficient information to code with codable children: Secondary | ICD-10-CM | POA: Insufficient documentation

## 2013-04-19 DIAGNOSIS — M5430 Sciatica, unspecified side: Secondary | ICD-10-CM | POA: Insufficient documentation

## 2013-04-19 LAB — URINALYSIS, ROUTINE W REFLEX MICROSCOPIC
Bilirubin Urine: NEGATIVE
Glucose, UA: NEGATIVE mg/dL
Hgb urine dipstick: NEGATIVE
Ketones, ur: NEGATIVE mg/dL
Leukocytes, UA: NEGATIVE
Nitrite: NEGATIVE
Protein, ur: NEGATIVE mg/dL
Specific Gravity, Urine: >1.03 — ABNORMAL HIGH (ref 1.005–1.030)
Urobilinogen, UA: 0.2 mg/dL (ref 0.0–1.0)
pH: 6 (ref 5.0–8.0)

## 2013-04-19 MED ORDER — KETOROLAC TROMETHAMINE 30 MG/ML IJ SOLN
30.0000 mg | Freq: Once | INTRAMUSCULAR | Status: AC
  Administered 2013-04-19: 30 mg via INTRAVENOUS
  Filled 2013-04-19: qty 1

## 2013-04-19 MED ORDER — OXYCODONE-ACETAMINOPHEN 5-325 MG PO TABS: 2.0000 | ORAL_TABLET | ORAL | Status: DC | PRN

## 2013-04-19 MED ORDER — HYDROMORPHONE HCL PF 1 MG/ML IJ SOLN
1.0000 mg | Freq: Once | INTRAMUSCULAR | Status: AC
  Administered 2013-04-19: 1 mg via INTRAVENOUS
  Filled 2013-04-19: qty 1

## 2013-04-19 MED ORDER — PREDNISONE 20 MG PO TABS: ORAL_TABLET | ORAL | Status: DC

## 2013-04-19 MED ORDER — SODIUM CHLORIDE 0.9 % IV BOLUS (SEPSIS)
1000.0000 mL | Freq: Once | INTRAVENOUS | Status: AC
  Administered 2013-04-19: 1000 mL via INTRAVENOUS

## 2013-04-19 MED ORDER — ONDANSETRON HCL 4 MG/2ML IJ SOLN
4.0000 mg | Freq: Once | INTRAMUSCULAR | Status: AC
  Administered 2013-04-19: 4 mg via INTRAVENOUS
  Filled 2013-04-19: qty 2

## 2013-04-19 NOTE — ED Notes (Signed)
Left flank pain starting yesterday with n/v.
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
## 2014-06-18 ENCOUNTER — Emergency Department (HOSPITAL_COMMUNITY)
Admission: EM | Admit: 2014-06-18 | Discharge: 2014-06-18 | Disposition: A | Discharge status: Home / Self Care | Payer: Self-pay | Attending: Emergency Medicine | Admitting: Emergency Medicine

## 2014-06-18 ENCOUNTER — Encounter (HOSPITAL_COMMUNITY): Payer: Self-pay | Admitting: Emergency Medicine

## 2014-06-18 DIAGNOSIS — K0889 Other specified disorders of teeth and supporting structures: Secondary | ICD-10-CM

## 2014-06-18 DIAGNOSIS — Z8739 Personal history of other diseases of the musculoskeletal system and connective tissue: Secondary | ICD-10-CM | POA: Insufficient documentation

## 2014-06-18 DIAGNOSIS — K088 Other specified disorders of teeth and supporting structures: Secondary | ICD-10-CM | POA: Insufficient documentation

## 2014-06-18 DIAGNOSIS — I10 Essential (primary) hypertension: Secondary | ICD-10-CM | POA: Insufficient documentation

## 2014-06-18 DIAGNOSIS — R0981 Nasal congestion: Secondary | ICD-10-CM | POA: Insufficient documentation

## 2014-06-18 DIAGNOSIS — E785 Hyperlipidemia, unspecified: Secondary | ICD-10-CM | POA: Insufficient documentation

## 2014-06-18 DIAGNOSIS — Z79899 Other long term (current) drug therapy: Secondary | ICD-10-CM | POA: Insufficient documentation

## 2014-06-18 DIAGNOSIS — Z8619 Personal history of other infectious and parasitic diseases: Secondary | ICD-10-CM | POA: Insufficient documentation

## 2014-06-18 DIAGNOSIS — R6883 Chills (without fever): Secondary | ICD-10-CM | POA: Insufficient documentation

## 2014-06-18 DIAGNOSIS — Z720 Tobacco use: Secondary | ICD-10-CM | POA: Insufficient documentation

## 2014-06-18 DIAGNOSIS — K029 Dental caries, unspecified: Secondary | ICD-10-CM | POA: Insufficient documentation

## 2014-06-18 DIAGNOSIS — F419 Anxiety disorder, unspecified: Secondary | ICD-10-CM | POA: Insufficient documentation

## 2014-06-18 DIAGNOSIS — J3489 Other specified disorders of nose and nasal sinuses: Secondary | ICD-10-CM | POA: Insufficient documentation

## 2014-06-18 DIAGNOSIS — Z7952 Long term (current) use of systemic steroids: Secondary | ICD-10-CM | POA: Insufficient documentation

## 2014-06-18 MED ORDER — HYDROCODONE-ACETAMINOPHEN 5-325 MG PO TABS: ORAL_TABLET | ORAL | Status: DC

## 2014-06-18 MED ORDER — CLINDAMYCIN HCL 150 MG PO CAPS: 300.0000 mg | ORAL_CAPSULE | Freq: Four times a day (QID) | ORAL | Status: DC

## 2014-06-18 NOTE — ED Notes (Signed)
Pt reports R lower jaw dental pain. Pt states she has a tooth that is broken off.
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
## 2014-06-18 NOTE — Discharge Instructions (Signed)
Dental Pain °Toothache is pain in or around a tooth. It may get worse with chewing or with cold or heat.  °HOME CARE °· Your dentist may use a numbing medicine during treatment. If so, you may need to avoid eating until the medicine wears off. Ask your dentist about this. °· Only take medicine as told by your dentist or doctor. °· Avoid chewing food near the painful tooth until after all treatment is done. Ask your dentist about this. °GET HELP RIGHT AWAY IF:  °· The problem gets worse or new problems appear. °· You have a fever. °· There is redness and puffiness (swelling) of the face, jaw, or neck. °· You cannot open your mouth. °· There is pain in the jaw. °· There is very bad pain that is not helped by medicine. °MAKE SURE YOU:  °· Understand these instructions. °· Will watch your condition. °· Will get help right away if you are not doing well or get worse. °Document Released: 09/24/2007 Document Revised: 06/30/2011 Document Reviewed: 09/24/2007 °ExitCare® Patient Information ©2015 [employer]. This information is not intended to replace advice given to you by your health care provider. Make sure you discuss any questions you have with your health care provider. ° ° ° °Emergency Department Resource Guide °1) Find a Doctor and Pay Out of Pocket °Although you won't have to find out who is covered by your insurance plan, it is a good idea to ask around and get recommendations. You will then need to call the office and see if the doctor you have chosen will accept you as a new patient and what types of options they offer for patients who are self-pay. Some doctors offer discounts or will set up payment plans for their patients who do not have insurance, but you will need to ask so you aren't surprised when you get to your appointment. ° °2) Contact Your Local Health Department °Not all health departments have doctors that can see patients for sick visits, but many do, so it is worth a call to see if yours does.  If you don't know where your local health department is, you can check in your phone book. The CDC also has a tool to help you locate your state's health department, and many state websites also have listings of all of their local health departments. ° °3) Find a Walk-in Clinic °If your illness is not likely to be very severe or complicated, you may want to try a walk in clinic. These are popping up all over the country in pharmacies, drugstores, and shopping centers. They're usually staffed by nurse practitioners or physician assistants that have been trained to treat common illnesses and complaints. They're usually fairly quick and inexpensive. However, if you have serious medical issues or chronic medical problems, these are probably not your best option. ° °No Primary Care Doctor: °- Call [facility] at  832-8000 - they can help you locate a primary care doctor that  accepts your insurance, provides certain services, etc. °- Physician Referral Service- 1-800-533-3463 ° °Chronic Pain Problems: °Organization         Address  Phone   Notes  °[facility]  (336) 297-2271 Patients need to be referred by their primary care doctor.  ° °Medication Assistance: °Organization         Address  Phone   Notes  °[place] [facility] [address] °[place], [place] [address] (336) 641-8030 --Must be a resident   of [place] °-- Must have NO insurance coverage whatsoever (no Medicaid/ Medicare, etc.) °-- The pt. MUST have a primary care doctor that directs their care regularly and follows them in the community °  °[facility]  (866) 331-1348   °[employer]  (888) 892-1162   ° °Agencies that provide inexpensive medical care: °Organization         Address  Phone   Notes  °[facility]  (336) 832-8035   °[facility]    (336) 832-7272   °[facility] [address] °[place], [place] [address] (336) 832-4777   °[facility] [address], °[place] (336) 271-4999   °[facility]    (336) 373-0678   °[facility]    (336) 272-1050   °[facility] ° [address], [place] Phone:  (336) 832-4444, Fax:  (336) 832-4440 Hours of Operation:  9 am - 6 pm, M-F.  Also accepts Medicaid/Medicare and self-pay.  °[facility] ° [address], [place] Phone: (336) 832-3150, Fax: (336) 832-3151. Hours of Operation:  8:30 am - 5:30 pm, M-F.  Also accepts Medicaid and self-pay.  °[facility] [place] [address], [place] Phone: (336) 878-6027   °[facility] [address], [place], [place] (336)723-1848, Ext. 123 Mondays & Thursdays: 7-9 AM.  First 15 patients are seen on a first come, first serve basis. °  ° °Medicaid-accepting [place] Providers: ° °Organization         Address  Phone   Notes  °[facility] [address], [place] (336) 641-2100 Also accepts self-pay patients.  °[facility] [address], [place] ° (336) 856-9996   °[facility] [address], [place] (336) 288-8857   °[facility] [address] [place] [address], [place] (336) 299-7000   °Veita Bland [address], [place]  ° (336) 373-1557 Only accepts [place] Access Medicaid patients after they have their name applied to their card.  ° °Self-Pay (no insurance) in [place]: ° °Organization         Address  Phone   Notes  °Sickle Cell Patients, [facility] [address], [place] (336) 832-1970   °[facility] [address], [place] (336) 832-4400   °[facility] ° [address], [place] (336) 992-4800   °[facility]/Dr. Osei-Bonsu ° [address] [place] [address], [place] or [address], [place] (336) 841-8500  Phone number for both [place] and [place] locations is the same.  °[facility] [address], [place] (336) 299-0000   °[facility] [address] [place] [address], [place] or [address] (336) 852-7530 °(336) 878-2260   °[facility] [address],  (336) 350-1642, phone; (336) 294-5005, fax Sees patients 1st and 3rd Saturday of every month.  Must not qualify for public or private insurance (i.e. Medicaid, Medicare, [place] Health Choice, Veterans' Benefits) • Household income should be no more than 200% of the poverty level •The clinic cannot treat you if you are pregnant or think you are pregnant • Sexually transmitted diseases are not treated at the clinic.  ° ° °Dental Care: °Organization         Address    Phone  Notes  °[facility] [address], [place] (336) 641-6152 Accepts children up to age 21 who are enrolled in Medicaid or [place] Health Choice; pregnant women with a Medicaid card; and children who have applied for Medicaid or [place] Health Choice, but were declined, whose parents can pay a reduced fee at time of service.  °[facility] [place]  [address], [place] (336) 641-7733 Accepts children up to age 21 who are enrolled in Medicaid or [place] Health Choice; pregnant women with a Medicaid card; and children who have applied for Medicaid or [place] Health Choice, but were declined, whose parents can pay a reduced fee at time of service.  °[facility] ° [address], [place] (336) 641-4533 Patients are seen by appointment only. Walk-ins are not accepted. [facility] will see patients 18 years of age and older. °Monday - Tuesday (8am-5pm) °Most Wednesdays (8:30-5pm) °$30 per visit, cash only  °[facility] ° [address], [place] (336) 641-4533 Patients are seen by appointment  only. Walk-ins are not accepted. [facility] will see patients 18 years of age and older. °One Wednesday Evening (Monthly: Volunteer Based).  $30 per visit, cash only  °[facility]  (919) 537-3737 for adults; Children under age 4, call Graduate Pediatric Dentistry at ([phone]) [phone]. Children aged 4-14, please call (919) [phone] to request a pediatric application. ° Dental services are provided in all areas of dental care including fillings, crowns and bridges, complete and partial dentures, implants, gum treatment, root canals, and extractions. Preventive care is also provided. Treatment is provided to both adults and children. °Patients are selected via a lottery and there is often a waiting list. °  °[facility] [address], °[place] ° (336) 763-8833 [email or web address] °  °[facility] [address], [place], [place] (336)723-1848, Ext. 123 Second and Fourth Thursday of each month, opens at 6:30 AM; Clinic ends at 9 AM.  Patients are seen on a first-come first-served basis, and a limited number are seen during each clinic.  ° °[facility] ° [address], [place], [place] (336) 723-7904   Eligibility Requirements °You must have lived in [place], [place], or [place] counties for at least the last three months. °  You cannot be eligible for state or federal sponsored healthcare insurance, including Veterans Administration, Medicaid, or Medicare. °  You generally cannot be eligible for healthcare insurance through your employer.  °  How to apply: °Eligibility screenings are held every Tuesday and Wednesday afternoon from 1:00 pm until 4:00 pm. You do not need an appointment for the interview!  °[facility] [address], [place], [place] 336-631-2330   °[facility]  336-342-8273   °[facility]  336-703-3100   °[facility]  336-570-6415   ° °Behavioral Health  Resources in the Community: °Intensive Outpatient Programs °Organization         Address  Phone  Notes  °[facility] [address], [place], [place] 336-878-6098   °[facility] [address], [place], [place] 336-832-9800   °[facility] [address], [place], [place] ° 336-882-2125   °[facility] [address],  °[place], [place] 1-800-853-5163 or 336-641-4981   °Substance Abuse Resources °Organization           Address  Phone  Notes  °[facility]  336-882-2125   °[facility]  336-784-9470   °[facility]  336-285-9073   °[facility]  336-845-3988   °Residential & Outpatient Substance Abuse Program  1-800-659-3381   °Psychological Services °Organization         Address  Phone  Notes  °[facility]  336- 832-9600   °[facility]  336- 378-7881   °[facility] [address], [place] 1-800-853-5163 or 336-641-4981   ° °Mobile Crisis Teams °Organization         Address  Phone  Notes  °[facility], Mobile Crisis Care Unit  1-877-626-1772   °Assertive °[facility] ° [address] [place], [place] 336-834-9664   °Sharon DeEsch [address] °[place] 336-554-5454   ° °Self-Help/Support Groups °Organization         Address  Phone             Notes  °[facility] - variety of support groups  336- 373-1402 Call for more information  °Narcotics Anonymous (NA), [facility] [address], °[place]  2 meetings at this location  ° °Residential Treatment Programs °Organization         Address  Phone  Notes  °[facility] [address],    °[place]  1-866-801-8205   °[facility] ° [address], [place], [place] 704-293-8524   °[facility] [address], [place] 336-845-3988 Admissions: 8am-3pm M-F  °[facility] [address]    °[place], [place] 336-841-1104   °[facility] [address], [place], [place] 336-379-7146   °[facility] [address]  °[place], [place] 336-285-9073   °[facility] Intensive Outpatient [address], [place], [place] 336-852-3033   °[facility] [address]  °[place], [place] 1-877-615-2722 or 336-784-9470   °[facility] [address] [place], [place] 336-227-7417 Accepts Medicaid  °[facility] [address]  °[place] 1-800-659-3381 Substance Abuse/Addiction Treatment  ° °[place] Behavioral Health Resources °Organization         Address  Phone  Notes  °[facility]  (888) 581-9988   °Julie Brannon, PhD [address] [place], [place]   (336) 349-5553 or (336) 951-0000   °[facility]   [address] °[place], [place] (336) 349-4454   °[facility] [address], [place], [place] (336) 342-8316 Insurance/Medicaid/sponsorship through [facility]  °[facility] [address]                                    [place], [place] (336) 342-8316 Therapy/tele-psych/case  °[facility] [address]  ° [place], [place] (336) 349-2233    °Dr. Arfeen  (336) 349-4544   °[facility] [place]  [employer] [place] [facility] 1) [address], [place] °2) [address], [place] °3)  [address], [place] (336) 349-3220 °(336) 342-7768 ° °(336) 342-8140   °[place] [facility] (336) 342-1394 or (336) 342-3537 (After Hours)    ° °  °

## 2014-08-02 ENCOUNTER — Encounter (INDEPENDENT_AMBULATORY_CARE_PROVIDER_SITE_OTHER): Payer: Self-pay | Admitting: *Deleted

## 2014-08-18 ENCOUNTER — Other Ambulatory Visit (HOSPITAL_COMMUNITY): Payer: Self-pay | Admitting: Physician Assistant

## 2014-08-18 DIAGNOSIS — Z0000 Encounter for general adult medical examination without abnormal findings: Secondary | ICD-10-CM

## 2014-08-18 DIAGNOSIS — Z1231 Encounter for screening mammogram for malignant neoplasm of breast: Secondary | ICD-10-CM

## 2014-08-23 ENCOUNTER — Ambulatory Visit (INDEPENDENT_AMBULATORY_CARE_PROVIDER_SITE_OTHER): Payer: Self-pay | Admitting: Internal Medicine

## 2014-09-06 ENCOUNTER — Other Ambulatory Visit (HOSPITAL_COMMUNITY): Payer: Self-pay

## 2014-09-06 ENCOUNTER — Ambulatory Visit (HOSPITAL_COMMUNITY): Payer: Self-pay

## 2014-09-16 ENCOUNTER — Emergency Department (HOSPITAL_COMMUNITY): Payer: Self-pay

## 2014-09-16 ENCOUNTER — Emergency Department (HOSPITAL_COMMUNITY)
Admission: EM | Admit: 2014-09-16 | Discharge: 2014-09-16 | Disposition: A | Discharge status: Home / Self Care | Payer: Self-pay | Attending: Emergency Medicine | Admitting: Emergency Medicine

## 2014-09-16 ENCOUNTER — Encounter (HOSPITAL_COMMUNITY): Payer: Self-pay | Admitting: Cardiology

## 2014-09-16 DIAGNOSIS — Z8619 Personal history of other infectious and parasitic diseases: Secondary | ICD-10-CM | POA: Insufficient documentation

## 2014-09-16 DIAGNOSIS — F419 Anxiety disorder, unspecified: Secondary | ICD-10-CM | POA: Insufficient documentation

## 2014-09-16 DIAGNOSIS — K219 Gastro-esophageal reflux disease without esophagitis: Secondary | ICD-10-CM | POA: Insufficient documentation

## 2014-09-16 DIAGNOSIS — Y92009 Unspecified place in unspecified non-institutional (private) residence as the place of occurrence of the external cause: Secondary | ICD-10-CM | POA: Insufficient documentation

## 2014-09-16 DIAGNOSIS — Y998 Other external cause status: Secondary | ICD-10-CM | POA: Insufficient documentation

## 2014-09-16 DIAGNOSIS — S0083XA Contusion of other part of head, initial encounter: Secondary | ICD-10-CM

## 2014-09-16 DIAGNOSIS — G43909 Migraine, unspecified, not intractable, without status migrainosus: Secondary | ICD-10-CM | POA: Insufficient documentation

## 2014-09-16 DIAGNOSIS — E785 Hyperlipidemia, unspecified: Secondary | ICD-10-CM | POA: Insufficient documentation

## 2014-09-16 DIAGNOSIS — Z87448 Personal history of other diseases of urinary system: Secondary | ICD-10-CM | POA: Insufficient documentation

## 2014-09-16 DIAGNOSIS — W01198A Fall on same level from slipping, tripping and stumbling with subsequent striking against other object, initial encounter: Secondary | ICD-10-CM | POA: Insufficient documentation

## 2014-09-16 DIAGNOSIS — Z720 Tobacco use: Secondary | ICD-10-CM | POA: Insufficient documentation

## 2014-09-16 DIAGNOSIS — S01112A Laceration without foreign body of left eyelid and periocular area, initial encounter: Secondary | ICD-10-CM | POA: Insufficient documentation

## 2014-09-16 DIAGNOSIS — M5430 Sciatica, unspecified side: Secondary | ICD-10-CM | POA: Insufficient documentation

## 2014-09-16 DIAGNOSIS — I10 Essential (primary) hypertension: Secondary | ICD-10-CM | POA: Insufficient documentation

## 2014-09-16 DIAGNOSIS — Z79899 Other long term (current) drug therapy: Secondary | ICD-10-CM | POA: Insufficient documentation

## 2014-09-16 DIAGNOSIS — Z23 Encounter for immunization: Secondary | ICD-10-CM | POA: Insufficient documentation

## 2014-09-16 DIAGNOSIS — Y9301 Activity, walking, marching and hiking: Secondary | ICD-10-CM | POA: Insufficient documentation

## 2014-09-16 DIAGNOSIS — S0181XA Laceration without foreign body of other part of head, initial encounter: Secondary | ICD-10-CM

## 2014-09-16 DIAGNOSIS — S0990XA Unspecified injury of head, initial encounter: Secondary | ICD-10-CM

## 2014-09-16 MED ORDER — SODIUM CHLORIDE 0.9 % IV SOLN
INTRAVENOUS | Status: DC
  Administered 2014-09-16: 17:00:00 via INTRAVENOUS

## 2014-09-16 MED ORDER — LIDOCAINE-EPINEPHRINE 1 %-1:100000 IJ SOLN
10.0000 mL | Freq: Once | INTRAMUSCULAR | Status: AC
  Administered 2014-09-16: 10 mL
  Filled 2014-09-16: qty 10

## 2014-09-16 MED ORDER — HYDROGEN PEROXIDE 3 % EX SOLN
CUTANEOUS | Status: AC
  Administered 2014-09-16: 19:00:00
  Filled 2014-09-16: qty 473

## 2014-09-16 MED ORDER — LIDOCAINE-EPINEPHRINE (PF) 1 %-1:200000 IJ SOLN
INTRAMUSCULAR | Status: AC
  Administered 2014-09-16: 19:00:00
  Filled 2014-09-16: qty 10

## 2014-09-16 MED ORDER — TETANUS-DIPHTH-ACELL PERTUSSIS 5-2.5-18.5 LF-MCG/0.5 IM SUSP
0.5000 mL | Freq: Once | INTRAMUSCULAR | Status: AC
  Administered 2014-09-16: 0.5 mL via INTRAMUSCULAR
  Filled 2014-09-16: qty 0.5

## 2014-09-16 NOTE — ED Notes (Signed)
Pt ambulated from room to nurses' station and back to room with the assistance of one person. Pt fairly steady on feet but still groggy and with slurred speech. Pt had been incontinent of urine in the bed so pt was changed into dry clothing and fresh linen placed on bed.

## 2014-09-16 NOTE — ED Notes (Signed)
Patient cleaned by myself and Barbara CowerJason, ED Tech. One laceration noted above patients left eyebrow, bleeding controlled. Patient c/o of left hip pain, old bruised noted. Pt slurred speech. States she has not taken any medications or drank any ETOH today. Pill bottle found in patient's sock, with an assortment of varies pills instead, she states some are Xanax.

## 2014-09-16 NOTE — ED Notes (Signed)
Pt states she tripped over the coffee table.  Hematoma and laceration above left eye.  Pt lethargic but oriented.  Per EMS approximately 300 cc coagulated blood laying next to her on the floor.
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
## 2014-09-16 NOTE — Discharge Instructions (Signed)
Blunt Trauma °You have been evaluated for injuries. You have been examined and your caregiver has not found injuries serious enough to require hospitalization. °It is common to have multiple bruises and sore muscles following an accident. These tend to feel worse for the first 24 hours. You will feel more stiffness and soreness over the next several hours and worse when you wake up the first morning after your accident. After this point, you should begin to improve with each passing day. The amount of improvement depends on the amount of damage done in the accident. °Following your accident, if some part of your body does not work as it should, or if the pain in any area continues to increase, you should return to the Emergency Department for re-evaluation.  °HOME CARE INSTRUCTIONS  °Routine care for sore areas should include: °· Ice to sore areas every 2 hours for 20 minutes while awake for the next 2 days. °· Drink extra fluids (not alcohol). °· Take a hot or warm shower or bath once or twice a day to increase blood flow to sore muscles. This will help you "limber up". °· Activity as tolerated. Lifting may aggravate neck or back pain. °· Only take over-the-counter or prescription medicines for pain, discomfort, or fever as directed by your caregiver. Do not use aspirin. This may increase bruising or increase bleeding if there are small areas where this is happening. °SEEK IMMEDIATE MEDICAL CARE IF: °· Numbness, tingling, weakness, or problem with the use of your arms or legs. °· A severe headache is not relieved with medications. °· There is a change in bowel or bladder control. °· Increasing pain in any areas of the body. °· Short of breath or dizzy. °· Nauseated, vomiting, or sweating. °· Increasing belly (abdominal) discomfort. °· Blood in urine, stool, or vomiting blood. °· Pain in either shoulder in an area where a shoulder strap would be. °· Feelings of lightheadedness or if you have a fainting  episode. °Sometimes it is not possible to identify all injuries immediately after the trauma. It is important that you continue to monitor your condition after the emergency department visit. If you feel you are not improving, or improving more slowly than should be expected, call your physician. If you feel your symptoms (problems) are worsening, return to the Emergency Department immediately. °Document Released: 01/01/2001 Document Revised: 06/30/2011 Document Reviewed: 11/24/2007 °[employer]® Patient Information ©2015 [employer]. This information is not intended to replace advice given to you by your health care provider. Make sure you discuss any questions you have with your health care provider. ° °

## 2014-09-20 ENCOUNTER — Encounter (INDEPENDENT_AMBULATORY_CARE_PROVIDER_SITE_OTHER): Payer: Self-pay | Admitting: *Deleted

## 2014-11-17 ENCOUNTER — Emergency Department (HOSPITAL_COMMUNITY)
Admission: EM | Admit: 2014-11-17 | Discharge: 2014-11-18 | Disposition: A | Discharge status: Home / Self Care | Payer: 59 | Attending: Emergency Medicine | Admitting: Emergency Medicine

## 2014-11-17 ENCOUNTER — Encounter (HOSPITAL_COMMUNITY): Payer: Self-pay | Admitting: Emergency Medicine

## 2014-11-17 DIAGNOSIS — W1839XA Other fall on same level, initial encounter: Secondary | ICD-10-CM | POA: Insufficient documentation

## 2014-11-17 DIAGNOSIS — Y9289 Other specified places as the place of occurrence of the external cause: Secondary | ICD-10-CM | POA: Insufficient documentation

## 2014-11-17 DIAGNOSIS — S50311A Abrasion of right elbow, initial encounter: Secondary | ICD-10-CM | POA: Diagnosis not present

## 2014-11-17 DIAGNOSIS — Z79899 Other long term (current) drug therapy: Secondary | ICD-10-CM | POA: Diagnosis not present

## 2014-11-17 DIAGNOSIS — Z720 Tobacco use: Secondary | ICD-10-CM | POA: Insufficient documentation

## 2014-11-17 DIAGNOSIS — Z87448 Personal history of other diseases of urinary system: Secondary | ICD-10-CM | POA: Diagnosis not present

## 2014-11-17 DIAGNOSIS — Z8619 Personal history of other infectious and parasitic diseases: Secondary | ICD-10-CM | POA: Diagnosis not present

## 2014-11-17 DIAGNOSIS — R4182 Altered mental status, unspecified: Secondary | ICD-10-CM | POA: Diagnosis not present

## 2014-11-17 DIAGNOSIS — Z8719 Personal history of other diseases of the digestive system: Secondary | ICD-10-CM | POA: Diagnosis not present

## 2014-11-17 DIAGNOSIS — W19XXXA Unspecified fall, initial encounter: Secondary | ICD-10-CM

## 2014-11-17 DIAGNOSIS — S00511A Abrasion of lip, initial encounter: Secondary | ICD-10-CM | POA: Diagnosis not present

## 2014-11-17 DIAGNOSIS — Y9389 Activity, other specified: Secondary | ICD-10-CM | POA: Insufficient documentation

## 2014-11-17 DIAGNOSIS — Z8679 Personal history of other diseases of the circulatory system: Secondary | ICD-10-CM | POA: Diagnosis not present

## 2014-11-17 DIAGNOSIS — Y998 Other external cause status: Secondary | ICD-10-CM | POA: Diagnosis not present

## 2014-11-17 DIAGNOSIS — E785 Hyperlipidemia, unspecified: Secondary | ICD-10-CM | POA: Diagnosis not present

## 2014-11-17 DIAGNOSIS — I10 Essential (primary) hypertension: Secondary | ICD-10-CM | POA: Diagnosis not present
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
## 2014-11-18 ENCOUNTER — Emergency Department (HOSPITAL_COMMUNITY): Payer: 59

## 2014-11-18 LAB — URINALYSIS, ROUTINE W REFLEX MICROSCOPIC
Bilirubin Urine: NEGATIVE
Glucose, UA: NEGATIVE mg/dL
Hgb urine dipstick: NEGATIVE
Ketones, ur: NEGATIVE mg/dL
Leukocytes, UA: NEGATIVE
Nitrite: NEGATIVE
Protein, ur: NEGATIVE mg/dL
Specific Gravity, Urine: 1.01 (ref 1.005–1.030)
Urobilinogen, UA: 0.2 mg/dL (ref 0.0–1.0)
pH: 7 (ref 5.0–8.0)

## 2014-11-18 LAB — COMPREHENSIVE METABOLIC PANEL
ALT: 14 U/L (ref 14–54)
AST: 17 U/L (ref 15–41)
Albumin: 3.6 g/dL (ref 3.5–5.0)
Alkaline Phosphatase: 84 U/L (ref 38–126)
Anion gap: 5 (ref 5–15)
BUN: 16 mg/dL (ref 6–20)
CO2: 27 mmol/L (ref 22–32)
Calcium: 8.8 mg/dL — ABNORMAL LOW (ref 8.9–10.3)
Chloride: 107 mmol/L (ref 101–111)
Creatinine, Ser: 0.71 mg/dL (ref 0.44–1.00)
GFR calc Af Amer: >60 mL/min (ref >60)
GFR calc non Af Amer: >60 mL/min (ref >60)
Glucose, Bld: 84 mg/dL (ref 65–99)
Potassium: 3.1 mmol/L — ABNORMAL LOW (ref 3.5–5.1)
Sodium: 139 mmol/L (ref 135–145)
Total Bilirubin: 0.3 mg/dL (ref 0.3–1.2)
Total Protein: 6.3 g/dL — ABNORMAL LOW (ref 6.5–8.1)

## 2014-11-18 LAB — CBC WITH DIFFERENTIAL/PLATELET
Basophils Absolute: 0.1 10*3/uL (ref 0.0–0.1)
Basophils Relative: 1 % (ref 0–1)
Eosinophils Absolute: 0.2 10*3/uL (ref 0.0–0.7)
Eosinophils Relative: 4 % (ref 0–5)
HCT: 36.9 % (ref 36.0–46.0)
Hemoglobin: 12.1 g/dL (ref 12.0–15.0)
Lymphocytes Relative: 46 % (ref 12–46)
Lymphs Abs: 2.4 10*3/uL (ref 0.7–4.0)
MCH: 30.8 pg (ref 26.0–34.0)
MCHC: 32.8 g/dL (ref 30.0–36.0)
MCV: 93.9 fL (ref 78.0–100.0)
Monocytes Absolute: 0.4 10*3/uL (ref 0.1–1.0)
Monocytes Relative: 7 % (ref 3–12)
Neutro Abs: 2.2 10*3/uL (ref 1.7–7.7)
Neutrophils Relative %: 42 % — ABNORMAL LOW (ref 43–77)
Platelets: 204 10*3/uL (ref 150–400)
RBC: 3.93 MIL/uL (ref 3.87–5.11)
RDW: 13.7 % (ref 11.5–15.5)
WBC: 5.2 10*3/uL (ref 4.0–10.5)

## 2014-11-18 LAB — RAPID URINE DRUG SCREEN, HOSP PERFORMED
Amphetamines: NOT DETECTED
Barbiturates: POSITIVE — AB
Benzodiazepines: POSITIVE — AB
Cocaine: NOT DETECTED
Opiates: NOT DETECTED
Tetrahydrocannabinol: POSITIVE — AB

## 2014-11-18 LAB — ETHANOL: Alcohol, Ethyl (B): <5 mg/dL (ref <5)

## 2014-11-18 LAB — ACETAMINOPHEN LEVEL: Acetaminophen (Tylenol), Serum: <10 ug/mL — ABNORMAL LOW (ref 10–30)

## 2014-11-18 LAB — SALICYLATE LEVEL: Salicylate Lvl: <4 mg/dL (ref 2.8–30.0)

## 2014-11-18 MED ORDER — SODIUM CHLORIDE 0.9 % IJ SOLN
INTRAMUSCULAR | Status: AC
  Filled 2014-11-18: qty 500

## 2014-11-18 MED ORDER — ACETAMINOPHEN 325 MG PO TABS
650.0000 mg | ORAL_TABLET | Freq: Once | ORAL | Status: AC
  Administered 2014-11-18: 650 mg via ORAL
  Filled 2014-11-18: qty 2

## 2014-11-18 MED ORDER — SODIUM CHLORIDE 0.9 % IV BOLUS (SEPSIS)
1000.0000 mL | Freq: Once | INTRAVENOUS | Status: AC
Start: 2014-11-18 — End: 2014-11-18
  Administered 2014-11-18: 1000 mL via INTRAVENOUS

## 2014-11-18 MED ORDER — SODIUM CHLORIDE 0.9 % IJ SOLN
INTRAMUSCULAR | Status: AC
  Filled 2014-11-18: qty 30

## 2014-11-18 NOTE — ED Notes (Signed)
Pt. Placed on room air, O2 sats 86%, pt. Placed back on 2L [place], O2 returned to [facility]

## 2014-11-18 NOTE — ED Provider Notes (Signed)
Patient fully awake alert and aware. Ambulatory. Stable on her feet. Oriented 3. Nothing additional to offer regarding her history. Appropriate for discharge.  Rolland Porter, MD 11/18/14 [phone]

## 2014-11-18 NOTE — ED Notes (Signed)
Pt. Drowsy but easily aroused. Pt. Oriented x4. Pt. Reports that she did not take any extra medications. Pt. sats 86% on room air, pt. Placed on 2L [place] sats 93%. Pt. With abrasion to right elbow and upper lip. Pt. Reports "siatica pain".

## 2014-11-18 NOTE — ED Notes (Signed)
Pt. Given ginger ale per request.

## 2014-11-18 NOTE — ED Notes (Signed)
Pt. C/o headache. EDP notified. 

## 2014-11-18 NOTE — Discharge Instructions (Signed)
Ice packs to the injured areas. You can take acetaminophen for pain 650 mg every 6 hrs as needed. Keep the abrasions clean and use triple antibiotic ointment on the wounds. Return to the ED for any problems listed on the head injury sheet.    Abrasions An abrasion is a cut or scrape of the skin. Abrasions do not go through all layers of the skin. HOME CARE  If a bandage (dressing) was put on your wound, change it as told by your doctor. If the bandage sticks, soak it off with warm.  Wash the area with water and soap 2 times a day. Rinse off the soap. Pat the area dry with a clean towel.  Put on medicated cream (ointment) as told by your doctor.  Change your bandage right away if it gets wet or dirty.  Only take medicine as told by your doctor.  See your doctor within 24-48 hours to get your wound checked.  Check your wound for redness, puffiness (swelling), or yellowish-white fluid (pus). GET HELP RIGHT AWAY IF:   You have more pain in the wound.  You have redness, swelling, or tenderness around the wound.  You have pus coming from the wound.  You have a fever or lasting symptoms for more than 2-3 days.  You have a fever and your symptoms suddenly get worse.  You have a bad smell coming from the wound or bandage. MAKE SURE YOU:   Understand these instructions.  Will watch your condition.  Will get help right away if you are not doing well or get worse. Document Released: 09/24/2007 Document Revised: 12/31/2011 Document Reviewed: 03/11/2011 [facility] Patient Information 2015 [place], [place]. This information is not intended to replace advice given to you by your health care provider. Make sure you discuss any questions you have with your health care provider.  Head Injury You have a head injury. Headaches and throwing up (vomiting) are common after a head injury. It should be easy to wake up from sleeping. Sometimes you must stay in the hospital. Most problems happen within  the first 24 hours. Side effects may occur up to 7-10 days after the injury.  WHAT ARE THE TYPES OF HEAD INJURIES? Head injuries can be as minor as a bump. Some head injuries can be more severe. More severe head injuries include:  A jarring injury to the brain (concussion).  A bruise of the brain (contusion). This mean there is bleeding in the brain that can cause swelling.  A cracked skull (skull fracture).  Bleeding in the brain that collects, clots, and forms a bump (hematoma). WHEN SHOULD I GET HELP RIGHT AWAY?   You are confused or sleepy.  You cannot be woken up.  You feel sick to your stomach (nauseous) or keep throwing up (vomiting).  Your dizziness or unsteadiness is getting worse.  You have very bad, lasting headaches that are not helped by medicine. Take medicines only as told by your doctor.  You cannot use your arms or legs like normal.  You cannot walk.  You notice changes in the black spots in the center of the colored part of your eye (pupil).  You have clear or bloody fluid coming from your nose or ears.  You have trouble seeing. During the next 24 hours after the injury, you must stay with someone who can watch you. This person should get help right away (call 911 in the [place] if you start to shake and are not able to control it (  have seizures), you pass out, or you are unable to wake up. HOW CAN I PREVENT A HEAD INJURY IN THE FUTURE?  Wear seat belts.  Wear a helmet while bike riding and playing sports like football.  Stay away from dangerous activities around the house. WHEN CAN I RETURN TO NORMAL ACTIVITIES AND ATHLETICS? See your doctor before doing these activities. You should not do normal activities or play contact sports until 1 week after the following symptoms have stopped:  Headache that does not go away.  Dizziness.  Poor attention.  Confusion.  Memory problems.  Sickness to your stomach or throwing  up.  Tiredness.  Fussiness.  Bothered by bright lights or loud noises.  Anxiousness or depression.  Restless sleep. MAKE SURE YOU:   Understand these instructions.  Will watch your condition.  Will get help right away if you are not doing well or get worse. Document Released: 03/20/2008 Document Revised: 08/22/2013 Document Reviewed: 12/13/2012 [facility] Patient Information 2015 [place], [place]. This information is not intended to replace advice given to you by your health care provider. Make sure you discuss any questions you have with your health care provider.

## 2015-05-24 ENCOUNTER — Encounter (HOSPITAL_COMMUNITY): Payer: Self-pay | Admitting: Emergency Medicine

## 2015-05-24 ENCOUNTER — Emergency Department (HOSPITAL_COMMUNITY): Payer: BLUE CROSS/BLUE SHIELD

## 2015-05-24 ENCOUNTER — Emergency Department (HOSPITAL_COMMUNITY)
Admission: EM | Admit: 2015-05-24 | Discharge: 2015-05-24 | Disposition: A | Discharge status: Home / Self Care | Payer: BLUE CROSS/BLUE SHIELD | Attending: Emergency Medicine | Admitting: Emergency Medicine

## 2015-05-24 DIAGNOSIS — Z87442 Personal history of urinary calculi: Secondary | ICD-10-CM | POA: Insufficient documentation

## 2015-05-24 DIAGNOSIS — R350 Frequency of micturition: Secondary | ICD-10-CM | POA: Insufficient documentation

## 2015-05-24 DIAGNOSIS — F17210 Nicotine dependence, cigarettes, uncomplicated: Secondary | ICD-10-CM | POA: Diagnosis not present

## 2015-05-24 DIAGNOSIS — Z79899 Other long term (current) drug therapy: Secondary | ICD-10-CM | POA: Insufficient documentation

## 2015-05-24 DIAGNOSIS — Z8619 Personal history of other infectious and parasitic diseases: Secondary | ICD-10-CM | POA: Insufficient documentation

## 2015-05-24 DIAGNOSIS — Z8739 Personal history of other diseases of the musculoskeletal system and connective tissue: Secondary | ICD-10-CM | POA: Insufficient documentation

## 2015-05-24 DIAGNOSIS — Z87448 Personal history of other diseases of urinary system: Secondary | ICD-10-CM | POA: Diagnosis not present

## 2015-05-24 DIAGNOSIS — K0889 Other specified disorders of teeth and supporting structures: Secondary | ICD-10-CM | POA: Diagnosis present

## 2015-05-24 DIAGNOSIS — K047 Periapical abscess without sinus: Secondary | ICD-10-CM | POA: Insufficient documentation

## 2015-05-24 DIAGNOSIS — F419 Anxiety disorder, unspecified: Secondary | ICD-10-CM | POA: Diagnosis not present

## 2015-05-24 DIAGNOSIS — R109 Unspecified abdominal pain: Secondary | ICD-10-CM

## 2015-05-24 DIAGNOSIS — K219 Gastro-esophageal reflux disease without esophagitis: Secondary | ICD-10-CM | POA: Diagnosis not present

## 2015-05-24 DIAGNOSIS — E785 Hyperlipidemia, unspecified: Secondary | ICD-10-CM | POA: Diagnosis not present

## 2015-05-24 DIAGNOSIS — I10 Essential (primary) hypertension: Secondary | ICD-10-CM | POA: Diagnosis not present

## 2015-05-24 DIAGNOSIS — R52 Pain, unspecified: Secondary | ICD-10-CM

## 2015-05-24 LAB — I-STAT CHEM 8, ED
BUN: 14 mg/dL (ref 6–20)
Calcium, Ion: 1.17 mmol/L (ref 1.12–1.23)
Chloride: 101 mmol/L (ref 101–111)
Creatinine, Ser: 0.7 mg/dL (ref 0.44–1.00)
Glucose, Bld: 88 mg/dL (ref 65–99)
HCT: 42 % (ref 36.0–46.0)
Hemoglobin: 14.3 g/dL (ref 12.0–15.0)
Potassium: 3.6 mmol/L (ref 3.5–5.1)
Sodium: 138 mmol/L (ref 135–145)
TCO2: 27 mmol/L (ref 0–100)

## 2015-05-24 LAB — URINALYSIS, ROUTINE W REFLEX MICROSCOPIC
Bilirubin Urine: NEGATIVE
Glucose, UA: NEGATIVE mg/dL
Hgb urine dipstick: NEGATIVE
Ketones, ur: NEGATIVE mg/dL
Leukocytes, UA: NEGATIVE
Nitrite: NEGATIVE
Protein, ur: NEGATIVE mg/dL
Specific Gravity, Urine: 1.01 (ref 1.005–1.030)
pH: 6.5 (ref 5.0–8.0)

## 2015-05-24 MED ORDER — TRAMADOL HCL 50 MG PO TABS: 50.0000 mg | ORAL_TABLET | Freq: Four times a day (QID) | ORAL | Status: DC | PRN

## 2015-05-24 MED ORDER — OXYCODONE-ACETAMINOPHEN 5-325 MG PO TABS
1.0000 | ORAL_TABLET | Freq: Once | ORAL | Status: AC
  Administered 2015-05-24: 1 via ORAL
  Filled 2015-05-24: qty 1

## 2015-05-24 MED ORDER — CEPHALEXIN 500 MG PO CAPS: 500.0000 mg | ORAL_CAPSULE | Freq: Four times a day (QID) | ORAL | Status: DC

## 2015-05-24 NOTE — Discharge Instructions (Signed)
Follow-up with your family doctor and a dentist in 1-2 weeks

## 2015-05-24 NOTE — ED Notes (Signed)
Pt states that she has been having left flank pain for 2 weeks with dental pain starting a few days ago.  Also reports urinary frequency.
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
## 2015-05-24 NOTE — ED Notes (Signed)
MD Zammit at bedside updating patient and family.  

## 2015-05-24 NOTE — ED Notes (Signed)
MD Zammit at bedside. 

## 2015-05-25 LAB — URINE CULTURE

## 2015-06-13 DIAGNOSIS — Z139 Encounter for screening, unspecified: Secondary | ICD-10-CM

## 2015-07-04 ENCOUNTER — Encounter (INDEPENDENT_AMBULATORY_CARE_PROVIDER_SITE_OTHER): Payer: Self-pay | Admitting: *Deleted

## 2015-07-19 ENCOUNTER — Encounter (INDEPENDENT_AMBULATORY_CARE_PROVIDER_SITE_OTHER): Payer: Self-pay | Admitting: *Deleted

## 2015-07-20 ENCOUNTER — Other Ambulatory Visit (INDEPENDENT_AMBULATORY_CARE_PROVIDER_SITE_OTHER): Payer: Self-pay | Admitting: *Deleted

## 2015-07-20 DIAGNOSIS — Z800 Family history of malignant neoplasm of digestive organs: Secondary | ICD-10-CM

## 2015-07-20 DIAGNOSIS — Z1211 Encounter for screening for malignant neoplasm of colon: Secondary | ICD-10-CM

## 2015-09-27 ENCOUNTER — Telehealth (INDEPENDENT_AMBULATORY_CARE_PROVIDER_SITE_OTHER): Payer: Self-pay | Admitting: *Deleted

## 2015-09-27 ENCOUNTER — Other Ambulatory Visit (INDEPENDENT_AMBULATORY_CARE_PROVIDER_SITE_OTHER): Payer: Self-pay | Admitting: *Deleted

## 2015-09-27 ENCOUNTER — Encounter (INDEPENDENT_AMBULATORY_CARE_PROVIDER_SITE_OTHER): Payer: Self-pay | Admitting: *Deleted

## 2015-09-27 MED ORDER — PEG 3350-KCL-NA BICARB-NACL 420 G PO SOLR: 4000.0000 mL | Freq: Once | ORAL | Status: DC

## 2015-09-27 NOTE — Telephone Encounter (Signed)
agree

## 2015-09-27 NOTE — Telephone Encounter (Signed)
Patient needs trilyte 

## 2015-09-27 NOTE — Telephone Encounter (Signed)
Referring MD/PCP: golding   Procedure: tcs  Reason/Indication:  Screening, fam hx colon ca  Has patient had this procedure before?  no  If so, when, by whom and where?    Is there a family history of colon cancer?  Yes, cousin  Who?  What age when diagnosed?    Is patient diabetic?   no      Does patient have prosthetic heart valve or mechanical valve?  no  Do you have a pacemaker?  no  Has patient ever had endocarditis? no  Has patient had joint replacement within last 12 months?  no  Does patient tend to be constipated or take laxatives? no  Does patient have a history of alcohol/drug use?  no  Is patient on Coumadin, Plavix and/or Aspirin? no  Medications: oxycodone 5 mg bid, xanax 1 mg tid, amlodipine 10 mg daily, otc zantac bid    Doesn't need propofol per Dr Karilyn Cotaehman Allergies: ivp dye, demerol  Medication Adjustment:   Procedure date & time: 10/25/15 at 830

## 2015-10-08 ENCOUNTER — Emergency Department (HOSPITAL_COMMUNITY)
Admission: EM | Admit: 2015-10-08 | Discharge: 2015-10-09 | Disposition: A | Discharge status: Other Acute Inpatient Hospital | Payer: BLUE CROSS/BLUE SHIELD | Attending: Emergency Medicine | Admitting: Emergency Medicine

## 2015-10-08 ENCOUNTER — Encounter (HOSPITAL_COMMUNITY): Payer: Self-pay | Admitting: Emergency Medicine

## 2015-10-08 DIAGNOSIS — Z79899 Other long term (current) drug therapy: Secondary | ICD-10-CM | POA: Diagnosis not present

## 2015-10-08 DIAGNOSIS — F17210 Nicotine dependence, cigarettes, uncomplicated: Secondary | ICD-10-CM | POA: Diagnosis not present

## 2015-10-08 DIAGNOSIS — X58XXXA Exposure to other specified factors, initial encounter: Secondary | ICD-10-CM | POA: Insufficient documentation

## 2015-10-08 DIAGNOSIS — Y929 Unspecified place or not applicable: Secondary | ICD-10-CM | POA: Diagnosis not present

## 2015-10-08 DIAGNOSIS — S025XXA Fracture of tooth (traumatic), initial encounter for closed fracture: Secondary | ICD-10-CM | POA: Insufficient documentation

## 2015-10-08 DIAGNOSIS — Y939 Activity, unspecified: Secondary | ICD-10-CM | POA: Diagnosis not present

## 2015-10-08 DIAGNOSIS — F1310 Sedative, hypnotic or anxiolytic abuse, uncomplicated: Secondary | ICD-10-CM

## 2015-10-08 DIAGNOSIS — K0889 Other specified disorders of teeth and supporting structures: Secondary | ICD-10-CM | POA: Diagnosis present

## 2015-10-08 DIAGNOSIS — E785 Hyperlipidemia, unspecified: Secondary | ICD-10-CM | POA: Insufficient documentation

## 2015-10-08 DIAGNOSIS — Z7982 Long term (current) use of aspirin: Secondary | ICD-10-CM | POA: Diagnosis not present

## 2015-10-08 DIAGNOSIS — F13188 Sedative, hypnotic or anxiolytic abuse with other sedative, hypnotic or anxiolytic-induced disorder: Secondary | ICD-10-CM | POA: Diagnosis not present

## 2015-10-08 DIAGNOSIS — F329 Major depressive disorder, single episode, unspecified: Secondary | ICD-10-CM | POA: Diagnosis not present

## 2015-10-08 DIAGNOSIS — F32A Depression, unspecified: Secondary | ICD-10-CM

## 2015-10-08 DIAGNOSIS — Y999 Unspecified external cause status: Secondary | ICD-10-CM | POA: Insufficient documentation

## 2015-10-08 LAB — RAPID URINE DRUG SCREEN, HOSP PERFORMED
Amphetamines: NOT DETECTED
Barbiturates: NOT DETECTED
Benzodiazepines: POSITIVE — AB
Cocaine: NOT DETECTED
Opiates: NOT DETECTED
Tetrahydrocannabinol: NOT DETECTED

## 2015-10-08 LAB — COMPREHENSIVE METABOLIC PANEL
ALT: 19 U/L (ref 14–54)
AST: 19 U/L (ref 15–41)
Albumin: 4.1 g/dL (ref 3.5–5.0)
Alkaline Phosphatase: 101 U/L (ref 38–126)
Anion gap: 7 (ref 5–15)
BUN: 12 mg/dL (ref 6–20)
CO2: 27 mmol/L (ref 22–32)
Calcium: 9.4 mg/dL (ref 8.9–10.3)
Chloride: 101 mmol/L (ref 101–111)
Creatinine, Ser: 0.75 mg/dL (ref 0.44–1.00)
GFR calc Af Amer: >60 mL/min (ref >60)
GFR calc non Af Amer: >60 mL/min (ref >60)
Glucose, Bld: 160 mg/dL — ABNORMAL HIGH (ref 65–99)
Potassium: 3.2 mmol/L — ABNORMAL LOW (ref 3.5–5.1)
Sodium: 135 mmol/L (ref 135–145)
Total Bilirubin: 0.6 mg/dL (ref 0.3–1.2)
Total Protein: 7.9 g/dL (ref 6.5–8.1)

## 2015-10-08 LAB — CBC
HCT: 42.9 % (ref 36.0–46.0)
Hemoglobin: 14.2 g/dL (ref 12.0–15.0)
MCH: 30.4 pg (ref 26.0–34.0)
MCHC: 33.1 g/dL (ref 30.0–36.0)
MCV: 91.9 fL (ref 78.0–100.0)
Platelets: 303 10*3/uL (ref 150–400)
RBC: 4.67 MIL/uL (ref 3.87–5.11)
RDW: 13.6 % (ref 11.5–15.5)
WBC: 9.8 10*3/uL (ref 4.0–10.5)

## 2015-10-08 LAB — ETHANOL: Alcohol, Ethyl (B): <5 mg/dL (ref <5)

## 2015-10-08 MED ORDER — ACETAMINOPHEN 325 MG PO TABS
650.0000 mg | ORAL_TABLET | ORAL | Status: DC | PRN
  Administered 2015-10-09: 650 mg via ORAL
  Filled 2015-10-08: qty 2

## 2015-10-08 MED ORDER — LORAZEPAM 1 MG PO TABS: 0.0000 mg | ORAL_TABLET | Freq: Four times a day (QID) | ORAL | Status: DC

## 2015-10-08 MED ORDER — AMOXICILLIN 250 MG PO CAPS
500.0000 mg | ORAL_CAPSULE | Freq: Once | ORAL | Status: AC
  Administered 2015-10-08: 500 mg via ORAL
  Filled 2015-10-08: qty 2

## 2015-10-08 MED ORDER — ADULT MULTIVITAMIN W/MINERALS CH: 1.0000 | ORAL_TABLET | Freq: Every day | ORAL | Status: DC

## 2015-10-08 MED ORDER — LORAZEPAM 1 MG PO TABS: 0.0000 mg | ORAL_TABLET | Freq: Two times a day (BID) | ORAL | Status: DC

## 2015-10-08 MED ORDER — FOLIC ACID 1 MG PO TABS: 1.0000 mg | ORAL_TABLET | Freq: Every day | ORAL | Status: DC

## 2015-10-08 MED ORDER — VITAMIN B-1 100 MG PO TABS: 100.0000 mg | ORAL_TABLET | Freq: Every day | ORAL | Status: DC

## 2015-10-08 MED ORDER — LORAZEPAM 1 MG PO TABS: 1.0000 mg | ORAL_TABLET | Freq: Four times a day (QID) | ORAL | Status: DC | PRN

## 2015-10-08 MED ORDER — THIAMINE HCL 100 MG/ML IJ SOLN: 100.0000 mg | Freq: Every day | INTRAMUSCULAR | Status: DC

## 2015-10-08 MED ORDER — ONDANSETRON HCL 4 MG PO TABS: 4.0000 mg | ORAL_TABLET | Freq: Three times a day (TID) | ORAL | Status: DC | PRN

## 2015-10-08 MED ORDER — LORAZEPAM 2 MG/ML IJ SOLN
1.0000 mg | Freq: Four times a day (QID) | INTRAMUSCULAR | Status: DC | PRN
Start: 2015-10-08 — End: 2015-10-09

## 2015-10-08 MED ORDER — NAPROXEN 250 MG PO TABS
500.0000 mg | ORAL_TABLET | Freq: Once | ORAL | Status: AC
Start: 2015-10-08 — End: 2015-10-08
  Administered 2015-10-08: 500 mg via ORAL
  Filled 2015-10-08: qty 2
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
## 2015-10-08 NOTE — ED Notes (Signed)
Pt c/o right lower dental pain since yesterday. Bad oral hygiene noted with missing teeth. Rating pain 10. Pt states she is supposed to take 1mg  xanax tid but usually takes 6-10 mg a day. Last dose was 3mg  around 9am today. States she knows she needs help getting off.  States takes her fioricet and percocet as prescribed.  States she lost her job and mother is dying of cancer and has been taking the xanax more lately. Pt became tearful. Denies si/hi but states she wants to be sent somewhere and if she is sent home she may take a lot of xanax to get her back up here. Pt consoled. nad at this time.
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
## 2015-10-08 NOTE — ED Notes (Addendum)
Patient complaining of lower right dental pain since yesterday. During triage, patient becomes tearful and states "I don't want to kill myself, but I take xanax and I need help getting off of them."

## 2015-10-08 NOTE — ED Notes (Signed)
TTS in progress 

## 2015-10-09 ENCOUNTER — Encounter (HOSPITAL_COMMUNITY): Payer: Self-pay | Admitting: *Deleted

## 2015-10-09 ENCOUNTER — Inpatient Hospital Stay (HOSPITAL_COMMUNITY)
Admission: AD | Admit: 2015-10-09 | Discharge: 2015-10-15 | DRG: 885 | Disposition: A | Discharge status: Home / Self Care | Payer: BLUE CROSS/BLUE SHIELD | Source: Intra-hospital | Attending: Psychiatry | Admitting: Psychiatry

## 2015-10-09 DIAGNOSIS — I10 Essential (primary) hypertension: Secondary | ICD-10-CM | POA: Diagnosis present

## 2015-10-09 DIAGNOSIS — F332 Major depressive disorder, recurrent severe without psychotic features: Secondary | ICD-10-CM | POA: Diagnosis present

## 2015-10-09 DIAGNOSIS — Z818 Family history of other mental and behavioral disorders: Secondary | ICD-10-CM | POA: Diagnosis not present

## 2015-10-09 DIAGNOSIS — K047 Periapical abscess without sinus: Secondary | ICD-10-CM | POA: Diagnosis present

## 2015-10-09 DIAGNOSIS — R45851 Suicidal ideations: Secondary | ICD-10-CM | POA: Diagnosis present

## 2015-10-09 DIAGNOSIS — F1320 Sedative, hypnotic or anxiolytic dependence, uncomplicated: Secondary | ICD-10-CM

## 2015-10-09 DIAGNOSIS — F17210 Nicotine dependence, cigarettes, uncomplicated: Secondary | ICD-10-CM | POA: Diagnosis present

## 2015-10-09 MED ORDER — AMLODIPINE BESYLATE 10 MG PO TABS
10.0000 mg | ORAL_TABLET | Freq: Every day | ORAL | Status: DC
  Administered 2015-10-11 – 2015-10-15 (×5): 10 mg via ORAL
  Filled 2015-10-09 (×8): qty 1

## 2015-10-09 MED ORDER — ACETAMINOPHEN 325 MG PO TABS: 650.0000 mg | ORAL_TABLET | Freq: Four times a day (QID) | ORAL | Status: DC | PRN

## 2015-10-09 MED ORDER — NICOTINE 21 MG/24HR TD PT24
21.0000 mg | MEDICATED_PATCH | Freq: Every day | TRANSDERMAL | Status: DC
  Filled 2015-10-09 (×7): qty 1

## 2015-10-09 MED ORDER — THIAMINE HCL 100 MG/ML IJ SOLN: 100.0000 mg | Freq: Once | INTRAMUSCULAR | Status: DC

## 2015-10-09 MED ORDER — AMOXICILLIN-POT CLAVULANATE 500-125 MG PO TABS
1.0000 | ORAL_TABLET | Freq: Three times a day (TID) | ORAL | Status: DC
  Administered 2015-10-09 – 2015-10-15 (×19): 500 mg via ORAL
  Filled 2015-10-09 (×24): qty 1

## 2015-10-09 MED ORDER — CLONIDINE HCL 0.1 MG PO TABS
0.1000 mg | ORAL_TABLET | ORAL | Status: AC
  Administered 2015-10-11 – 2015-10-13 (×4): 0.1 mg via ORAL
  Filled 2015-10-09 (×4): qty 1

## 2015-10-09 MED ORDER — CHLORDIAZEPOXIDE HCL 25 MG PO CAPS
25.0000 mg | ORAL_CAPSULE | Freq: Four times a day (QID) | ORAL | Status: AC
  Administered 2015-10-09 (×3): 25 mg via ORAL
  Filled 2015-10-09 (×3): qty 1

## 2015-10-09 MED ORDER — ASPIRIN-ACETAMINOPHEN-CAFFEINE 250-250-65 MG PO TABS
1.0000 | ORAL_TABLET | Freq: Four times a day (QID) | ORAL | Status: DC | PRN
  Administered 2015-10-11 – 2015-10-15 (×9): 1 via ORAL
  Filled 2015-10-09 (×8): qty 1

## 2015-10-09 MED ORDER — CHLORDIAZEPOXIDE HCL 25 MG PO CAPS
25.0000 mg | ORAL_CAPSULE | Freq: Every day | ORAL | Status: AC
  Administered 2015-10-12: 25 mg via ORAL
  Filled 2015-10-09: qty 1

## 2015-10-09 MED ORDER — ADULT MULTIVITAMIN W/MINERALS CH
1.0000 | ORAL_TABLET | Freq: Every day | ORAL | Status: DC
  Administered 2015-10-09 – 2015-10-15 (×7): 1 via ORAL
  Filled 2015-10-09 (×10): qty 1

## 2015-10-09 MED ORDER — ALUM & MAG HYDROXIDE-SIMETH 200-200-20 MG/5ML PO SUSP
30.0000 mL | ORAL | Status: DC | PRN
  Administered 2015-10-10 – 2015-10-15 (×4): 30 mL via ORAL
  Filled 2015-10-09 (×4): qty 30

## 2015-10-09 MED ORDER — METHOCARBAMOL 500 MG PO TABS
500.0000 mg | ORAL_TABLET | Freq: Three times a day (TID) | ORAL | Status: AC | PRN
  Administered 2015-10-09 – 2015-10-14 (×11): 500 mg via ORAL
  Filled 2015-10-09 (×8): qty 1

## 2015-10-09 MED ORDER — POTASSIUM CHLORIDE CRYS ER 20 MEQ PO TBCR
20.0000 meq | EXTENDED_RELEASE_TABLET | Freq: Two times a day (BID) | ORAL | Status: AC
  Administered 2015-10-09 – 2015-10-12 (×5): 20 meq via ORAL
  Filled 2015-10-09 (×7): qty 1

## 2015-10-09 MED ORDER — MIRTAZAPINE 7.5 MG PO TABS
7.5000 mg | ORAL_TABLET | Freq: Every day | ORAL | Status: DC
  Administered 2015-10-09 – 2015-10-10 (×2): 7.5 mg via ORAL
  Filled 2015-10-09 (×4): qty 1

## 2015-10-09 MED ORDER — MAGNESIUM HYDROXIDE 400 MG/5ML PO SUSP: 30.0000 mL | Freq: Every day | ORAL | Status: DC | PRN

## 2015-10-09 MED ORDER — VITAMIN B-1 100 MG PO TABS
100.0000 mg | ORAL_TABLET | Freq: Every day | ORAL | Status: DC
  Administered 2015-10-09 – 2015-10-15 (×7): 100 mg via ORAL
  Filled 2015-10-09 (×8): qty 1

## 2015-10-09 MED ORDER — CHLORDIAZEPOXIDE HCL 25 MG PO CAPS
25.0000 mg | ORAL_CAPSULE | ORAL | Status: AC
  Administered 2015-10-11 (×2): 25 mg via ORAL
  Filled 2015-10-09 (×2): qty 1

## 2015-10-09 MED ORDER — CLONIDINE HCL 0.1 MG PO TABS
0.1000 mg | ORAL_TABLET | Freq: Four times a day (QID) | ORAL | Status: DC
  Administered 2015-10-09: 0.1 mg via ORAL
  Filled 2015-10-09 (×8): qty 1

## 2015-10-09 MED ORDER — HYDROXYZINE HCL 25 MG PO TABS
25.0000 mg | ORAL_TABLET | Freq: Four times a day (QID) | ORAL | Status: AC | PRN
  Administered 2015-10-10 – 2015-10-13 (×5): 25 mg via ORAL
  Filled 2015-10-09 (×5): qty 1

## 2015-10-09 MED ORDER — CHLORDIAZEPOXIDE HCL 25 MG PO CAPS
25.0000 mg | ORAL_CAPSULE | Freq: Four times a day (QID) | ORAL | Status: DC | PRN
  Administered 2015-10-09: 25 mg via ORAL
  Filled 2015-10-09: qty 1

## 2015-10-09 MED ORDER — ONDANSETRON 4 MG PO TBDP
4.0000 mg | ORAL_TABLET | Freq: Four times a day (QID) | ORAL | Status: AC | PRN
  Administered 2015-10-10 – 2015-10-12 (×3): 4 mg via ORAL
  Filled 2015-10-09 (×2): qty 1

## 2015-10-09 MED ORDER — CLONIDINE HCL 0.1 MG PO TABS
0.1000 mg | ORAL_TABLET | Freq: Every day | ORAL | Status: DC
  Administered 2015-10-13: 0.1 mg via ORAL
  Filled 2015-10-09 (×2): qty 1

## 2015-10-09 MED ORDER — CHLORDIAZEPOXIDE HCL 25 MG PO CAPS
25.0000 mg | ORAL_CAPSULE | Freq: Three times a day (TID) | ORAL | Status: AC
  Administered 2015-10-10 (×3): 25 mg via ORAL
  Filled 2015-10-09 (×3): qty 1

## 2015-10-09 MED ORDER — TRAZODONE HCL 100 MG PO TABS
100.0000 mg | ORAL_TABLET | Freq: Every day | ORAL | Status: DC
  Filled 2015-10-09 (×2): qty 1

## 2015-10-09 MED ORDER — DICYCLOMINE HCL 20 MG PO TABS
20.0000 mg | ORAL_TABLET | Freq: Four times a day (QID) | ORAL | Status: AC | PRN
  Administered 2015-10-09: 20 mg via ORAL
  Filled 2015-10-09: qty 1

## 2015-10-09 MED ORDER — CHLORDIAZEPOXIDE HCL 25 MG PO CAPS: 25.0000 mg | ORAL_CAPSULE | Freq: Four times a day (QID) | ORAL | Status: AC | PRN

## 2015-10-09 MED ORDER — NAPROXEN 500 MG PO TABS
500.0000 mg | ORAL_TABLET | Freq: Two times a day (BID) | ORAL | Status: AC | PRN
  Administered 2015-10-09 – 2015-10-12 (×4): 500 mg via ORAL
  Filled 2015-10-09 (×8): qty 1

## 2015-10-09 NOTE — Progress Notes (Signed)
Recreation Therapy Notes  Animal-Assisted Activity (AAA) Program Checklist/Progress Notes Patient Eligibility Criteria Checklist & Daily Group note for Rec Tx Intervention  Date: 06.20.2017 Time: 2:45pm Location: [address] Dayroom    AAA/T Program Assumption of Risk Form signed by Patient/ or Parent Legal Guardian Yes  Patient is free of allergies or sever asthma Yes  Patient reports no fear of animals Yes  Patient reports no history of cruelty to animals Yes  Patient understands his/her participation is voluntary Yes  Behavioral Response: Did not attend.   [name], LRT/CTRS        Hennie Gosa L 10/09/2015 2:58 PM 

## 2015-10-09 NOTE — [facility] Group Notes (Signed)
[facility] LCSW Group Therapy 10/09/2015  1:15 PM   Type of Therapy: Group Therapy  Participation Level: Did Not Attend. Patient invited to participate but declined.   Sham Alviar, MSW, LCSW Clinical Social Worker [facility] [phone]   
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
## 2015-10-09 NOTE — ED Notes (Addendum)
Pt up to bathroom escorted by sitter and back to bed.

## 2015-10-09 NOTE — Progress Notes (Signed)
Psychoeducational Group Note  Date:  10/09/2015 Time:  2030 Group Topic/Focus:  wrap up group  Participation Level: Did Not Attend  Participation Quality:  Not Applicable  Affect:  Not Applicable  Cognitive:  Not Applicable  Insight:  Not Applicable  Engagement in Group: Not Applicable  Additional Comments:  Pt remained in bed during group time.   Marcille BuffyMcNeil, Valena Ivanov S 10/09/2015, 9:35 PM
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
## 2015-10-09 NOTE — Progress Notes (Signed)
Pt observed at the beginning of the shift awake in bed.  She reports that she is still having significant pain from her abscessed tooth and is receiving Naproxen for her pain.  She received her second dose at 2047 this evening.  She denies having suicidal thoughts at this time.  She denies HI/AVH.  Her clonidine was again held this evening d/t a low BP.  Pt was encouraged to increase her fluid intake.  Pt denies having any withdrawal symptoms at this time.  Her main complaint is the tooth pain.  She is also receiving antibiotics as well.  Pt has stayed in her room most of the evening.  Support and encouragement offered.  Pt makes her needs known to staff.  Discharge plans are in process.  Safety maintained with q15 minute checks.

## 2015-10-09 NOTE — B[facility] Suicide Risk Assessment (Signed)
[facility] INPATIENT:  Family/Significant Other Suicide Prevention Education  Suicide Prevention Education:  Patient Refusal for Family/Significant Other Suicide Prevention Education: The patient [name] has refused to provide written consent for family/significant other to be provided Family/Significant Other Suicide Prevention Education during admission and/or prior to discharge.  Physician notified.  SPE completed with pt, as pt refused to consent to family contact. SPI pamphlet provided to pt and pt was encouraged to share information with support network, ask questions, and talk about any concerns relating to SPE. Pt denies access to guns/firearms and verbalized understanding of information provided. Mobile Crisis information also provided to pt.   Smart, Mako Pelfrey LCSW 10/09/2015, 2:09 PM

## 2015-10-09 NOTE — Progress Notes (Signed)
This Clinical research associatewriter spoke with pt's. assigned nurse Claris Gowerharlotte and [facility] shift.  Denver FasterVictoria Dulcemaria Bula RN-BC

## 2015-10-09 NOTE — Progress Notes (Signed)
Adult Psychoeducational Group Note  Date:  10/09/2015 Time:  0900  Group Topic/Focus:  Orientation:   The focus of this group is to educate the patient on the purpose and policies of crisis stabilization and provide a format to answer questions about their admission.  The group details unit policies and expectations of patients while admitted.  Participation Level:  Did Not Attend  Participation Quality:    Affect:    Cognitive:    Insight:   Engagement in Group:    Modes of Intervention:    Additional Comments:    Kearra Calkin L 10/09/2015, 9:41 AM
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
## 2015-10-09 NOTE — Plan of Care (Signed)
Problem: Coping: Goal: Ability to cope will improve Outcome: Not Progressing Patient is not able to cope with her stressors.  She feels overwhelmed and not able to cope without using xanax.

## 2015-10-09 NOTE — Tx Team (Signed)
Initial Interdisciplinary Treatment Plan   PATIENT STRESSORS: Financial difficulties Legal issue Occupational concerns Substance abuse Unresolved grief   PATIENT STRENGTHS: Average or above average intelligence Communication skills Motivation for treatment/growth Work skills   PROBLEM LIST: Problem List/Patient Goals Date to be addressed Date deferred Reason deferred Estimated date of resolution  "I need help with my addiction to drugs" 10/09/2015     "I need help with my depression" 10/09/2015     Benzo abuse 10/09/2015     Opioid abuse 10/09/2015     Imminent death of mother 10/09/2015     No support system 10/09/2015                        DISCHARGE CRITERIA:  Improved stabilization in mood, thinking, and/or behavior Need for constant or close observation no longer present Verbal commitment to aftercare and medication compliance Withdrawal symptoms are absent or subacute and managed without 24-hour nursing intervention  PRELIMINARY DISCHARGE PLAN: Attend 12-step recovery group Return to previous living arrangement  PATIENT/FAMIILY INVOLVEMENT: This treatment plan has been presented to and reviewed with the patient, [name].  The patient and family have been given the opportunity to ask questions and make suggestions.  Cranford MonBeaudry, Nashley Cordoba Evans 10/09/2015, 12:39 PM

## 2015-10-09 NOTE — Progress Notes (Addendum)
D: Patient has been resting quietly.  Vitals wnl.  Patient is clonidine and librium protocol.  Also given prns for withdrawal symptoms.  She has been calm.  Patient denies SI, however, states if she goes home, she will overdose on xanax and pain medications.  Patient complains of abscess pain 10/10.  Naproxen and antibiotic given. A: Continue to monitor medication management and MD orders.  Safety checks completed every 15 minutes per protocol.  Offer support and encouragement as needed. R: Patient is receptive to staff; her behavior is appropriate.
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
## 2015-10-10 LAB — COMPREHENSIVE METABOLIC PANEL
ALT: 16 U/L (ref 14–54)
AST: 15 U/L (ref 15–41)
Albumin: 3.6 g/dL (ref 3.5–5.0)
Alkaline Phosphatase: 87 U/L (ref 38–126)
Anion gap: 7 (ref 5–15)
BUN: 22 mg/dL — ABNORMAL HIGH (ref 6–20)
CO2: 25 mmol/L (ref 22–32)
Calcium: 9.2 mg/dL (ref 8.9–10.3)
Chloride: 106 mmol/L (ref 101–111)
Creatinine, Ser: 0.8 mg/dL (ref 0.44–1.00)
GFR calc Af Amer: >60 mL/min (ref >60)
GFR calc non Af Amer: >60 mL/min (ref >60)
Glucose, Bld: 98 mg/dL (ref 65–99)
Potassium: 3.3 mmol/L — ABNORMAL LOW (ref 3.5–5.1)
Sodium: 138 mmol/L (ref 135–145)
Total Bilirubin: 0.5 mg/dL (ref 0.3–1.2)
Total Protein: 7 g/dL (ref 6.5–8.1)

## 2015-10-10 LAB — LIPID PANEL
Cholesterol: 229 mg/dL — ABNORMAL HIGH (ref 0–200)
HDL: 60 mg/dL (ref >40)
LDL Cholesterol: 146 mg/dL — ABNORMAL HIGH (ref 0–99)
Total CHOL/HDL Ratio: 3.8 RATIO
Triglycerides: 117 mg/dL (ref <150)
VLDL: 23 mg/dL (ref 0–40)

## 2015-10-10 LAB — TSH: TSH: 0.269 u[IU]/mL — ABNORMAL LOW (ref 0.350–4.500)

## 2015-10-10 NOTE — Progress Notes (Signed)
D: Patient is isolative to room.  She is complaining of withdrawal symptoms from benzos such as sweating, tremors and lethargy.  She denies any self harm thoughts, however, feels if she goes home she will overdose.  She denies AVH/HI.  She has not been attending groups.  She has flat, blunted affect; she has poor hygiene.  The swelling from her abscessed tooth has slightly reduced in size.  She continues on naproxen for pain. A: Continue to monitor medication management and MD orders.  Safety checks completed every 15 minutes per protocol. Offer support and encouragement as needed. R: Patient is receptive to staff; her behavior is appropriate.

## 2015-10-10 NOTE — [facility] Group Notes (Signed)
[facility] Aftercare Dis[facility] Planning Group Note   10/10/2015 10:03 AM  Participation Quality:   Invited. DID NOT ATTEND. Chose to remain in bed.   Smart, Daisy Mcneel LCSW

## 2015-10-10 NOTE — Progress Notes (Signed)
Recreation Therapy Notes  Date: 06.21.2017 Time: 9:30am Location: [address] Dayroom   Group Topic: Stress Management  Goal Area(s) Addresses:  Patient will actively participate in stress management techniques presented during session.   Behavioral Response: Did not attend.   Oriyah Lamphear L Donyetta Ogletree, LRT/CTRS         Lillith Mcneff L 10/10/2015 1:26 PM 

## 2015-10-10 NOTE — B[facility] Group Notes (Signed)
[facility] LCSW Group Therapy  10/10/2015 4:06 PM  Type of Therapy:  Group Therapy  Participation Level:  Did Not Attend-pt invited. Chose to remain in bed.   Summary of Progress/Problems: Today's Topic: Overcoming Obstacles. Patients identified one short term goal and potential obstacles in reaching this goal. Patients processed barriers involved in overcoming these obstacles. Patients identified steps necessary for overcoming these obstacles and explored motivation (internal and external) for facing these difficulties head on.   Smart, Lavergne Hiltunen LCSW 10/10/2015, 4:06 PM

## 2015-10-11 LAB — HEMOGLOBIN A1C
Hgb A1c MFr Bld: 5.6 % (ref 4.8–5.6)
Mean Plasma Glucose: 114 mg/dL

## 2015-10-11 MED ORDER — NITROGLYCERIN 0.4 MG SL SUBL
0.4000 mg | SUBLINGUAL_TABLET | SUBLINGUAL | Status: DC | PRN
  Administered 2015-10-11: 0.4 mg via SUBLINGUAL

## 2015-10-11 MED ORDER — MIRTAZAPINE 15 MG PO TABS
15.0000 mg | ORAL_TABLET | Freq: Every day | ORAL | Status: DC
  Administered 2015-10-11 – 2015-10-14 (×4): 15 mg via ORAL
  Filled 2015-10-11 (×5): qty 1

## 2015-10-11 MED ORDER — NITROGLYCERIN 0.4 MG SL SUBL
SUBLINGUAL_TABLET | SUBLINGUAL | Status: AC
  Filled 2015-10-11: qty 1

## 2015-10-11 NOTE — Progress Notes (Signed)
D- Patient has been is present on the milieu cooperative treatment.  Patient denies SI, HI and AVH.  Patient has had no issues of behavioral dyscontrol this shift.   A- Assess patient for safety, offer medications as prescribed, engage patient in 1:1 staff talks.   R- Patient able to contract for safety.
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
## 2015-10-11 NOTE — Progress Notes (Signed)
Pt has been in bed all evening.  She did not get up for the NA group, although she was invited and encouraged to attend.  Pt was reminded that her goal for the day was to be out of bed, but she said that her tooth pain was still causing her to feel bad.  She denies SI/HI/AVH to Clinical research associatewriter.  She has been polite and pleasant.  She has been med compliant.  Her BP continues to be low, so the clonidine was not given tonight.  Pt was encouraged to drink fluids and increase her activity.  Pt voices no needs or concerns other than her pain issues which were addressed.  Support and encouragement offered.  Safety maintained with q15 minute checks.

## 2015-10-11 NOTE — Progress Notes (Signed)
Patient did attend the evening karaoke group. Pt was engaged and supportive but did not participate by singing a song.    

## 2015-10-11 NOTE — [facility] Group Notes (Signed)
[facility] LCSW Group Therapy 10/11/2015 1:15 PM Type of Therapy: Group Therapy Participation Level: Active  Participation Quality: Attentive, Sharing and Supportive  Affect: Depressed and Flat  Cognitive: Alert and Oriented  Insight: Developing/Improving and Engaged  Engagement in Therapy: Developing/Improving and Engaged  Modes of Intervention: Activity, Clarification, Confrontation, Discussion, Education, Exploration, Limit-setting, Orientation, Problem-solving, Rapport Building, Dance movement psychotherapisteality Testing, Socialization and Support  Summary of Progress/Problems: Patient was attentive and engaged with speaker from [employer]. Patient was attentive to speaker while they shared their story of dealing with mental health and overcoming it. Patient expressed interest in their programs and services and received information on their agency. Patient processed ways they can relate to the speaker.   Samuella BruinKristin Radin Raptis, LCSW Clinical Social Worker [facility] [phone]
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
## 2015-10-12 MED ORDER — TRAZODONE HCL 100 MG PO TABS
100.0000 mg | ORAL_TABLET | Freq: Every day | ORAL | Status: DC
  Administered 2015-10-12: 100 mg via ORAL
  Filled 2015-10-12 (×4): qty 1

## 2015-10-12 MED ORDER — SUMATRIPTAN SUCCINATE 25 MG PO TABS
25.0000 mg | ORAL_TABLET | ORAL | Status: DC | PRN
  Administered 2015-10-13 – 2015-10-15 (×6): 25 mg via ORAL
  Filled 2015-10-12 (×7): qty 1

## 2015-10-12 NOTE — [facility] Group Notes (Signed)
[facility]   10/12/2015 10:37 AM  Participation Quality:  Invited. DID NOT ATTEND. Chose to rest in bed. Pt made aware of 10:30AM Interview [facility] with [name].  Smart, Jean Rambeau LCSW
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
## 2015-10-12 NOTE — Progress Notes (Signed)
D.  Pt presents with flat affect but brightens with conversation.  Pt pleased with new medication orders for her insomnia and is hopeful she will sleep better tonight.  Pt was positive for evening AA group, and has been observed interacting appropriately with peers on the unit.  A.  Support and encouragement offered, medication given as ordered  R.  Pt remains safe on the unit, will continue to monitor.

## 2015-10-12 NOTE — [facility] Group Notes (Signed)
[facility] LCSW Group Therapy  10/12/2015 3:26 PM  Type of Therapy:  Group Therapy  Participation Level:  Active  Participation Quality:  Attentive  Affect:  Appropriate  Cognitive:  Oriented  Insight:  Improving  Engagement in Therapy:  Improving  Modes of Intervention:  Confrontation, Discussion, Education, Exploration, Problem-solving, Rapport Building, Socialization and Support  Summary of Progress/Problems: Feelings around Relapse. Group members discussed the meaning of relapse and shared personal stories of relapse, how it affected them and others, and how they perceived themselves during this time. Group members were encouraged to identify triggers, warning signs and coping skills used when facing the possibility of relapse. Social supports were discussed and explored in detail. Post Acute Withdrawal Syndrome (handout provided) was introduced and examined. Pt's were encouraged to ask questions, talk about key points associated with PAWS, and process this information in terms of relapse prevention. [name] was attentive and engaged during today's processing group. She talked about her latest relapse and that she is planning to attend [facility] on Monday. "I want to move into an [facility] house from there." Patient acknowledges the need for continued community support in an effort to maintain her sobriety after detox and treatment.   Smart, Jame Morrell LCSW 10/12/2015, 3:26 PM

## 2015-10-12 NOTE — Progress Notes (Signed)
Pt has been up this evening and out of bed.  Pt even went to [employer] tonight.  She reports she is feeling better, although she is still having tooth pain.  She denies SI/HI/AVH at this time.  She voiced no complaints or concerns.  She is hoping to go for long term treatment, but is somewhat afraid to be away from her mother d/t her illness.  She says that [facility] is [facility].  She says that her sister has encouraged her to go and that her mother has told her that she wants her to go somewhere to get well.  Pt's affect seems brighter tonight.  Pt makes her needs known to staff.  Pt is polite and cooperative with staff.  Support and encouragement offered.  Discharge plans are in process.  Safety maintained with q15 minute checks.
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
## 2015-10-12 NOTE — Plan of Care (Signed)
Problem: Education: Goal: Utilization of techniques to improve thought processes will improve Outcome: Progressing Nurse discussed depression/coping skills with patient.    

## 2015-10-12 NOTE — Progress Notes (Signed)
D:  Patient's self inventory sheet, patient has poor sleep, sleep medication was not helpful.  Fair appetite, low energy level, poor concentration.  Rated depression and anxiety 10, hopeless 9.  Withdrawals, chilling, cravings, cramping, agitation, nausea, irritability, anxiety attack.  Denied SI.  Physical problems, shoulder and back pain, worst pain in past 24 hours #10.  Pain medication does help some.  Goal is to feel better, possibly a facility to go to after discharge.  Plans to talk to SW.  Would like to get clothes out of lost and found.  Does have discharge plans. A:  Medications administered per MD orders.  Emotional support and encouragement given patient. R:  Denied SI and HI, contracts for safety.  Denied A/V hallucinations.  Safety maintained with 15 minute checks.

## 2015-10-13 NOTE — Progress Notes (Signed)
[name] presents to the window...she is quiet, flat and blunted. AShe takes her scheduled meds as planned. SHe requests prn imitrex x2 today, for c/o headaches ( she says she suffers at home with them). A She completes her daily assessment and on it she writes she denies SI  And she rates her depression, hopelessness and anxeity " 12/28/08", respectively. R Safety in place.

## 2015-10-13 NOTE — [facility] Group Notes (Signed)
Date:  10/13/2015 Time:  10:00AM-11:00AM  Group Topic/Focus:  The main focus of today's therapy group was to identify unhealthy coping techniques which led to hospitalization and to start looking at healthy coping skills to be learned to use instead in similar circumstances.  Motivational Interviewing was used to highlight patient ambivilence.  Scaling questions were asked to help define for patients where they are right now on the issue they had initially raised.  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  With that going on, the unhealthy coping techniques she used were isolation and using drugs.  She stated her first step toward changing is going [facility] for [facility] treatment.  Her desire to change is 10 out of 10.  Sarina SerGrossman-Orr, Azizi Bally Jo 10/13/2015, 12:28 PM
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
## 2015-10-14 MED ORDER — HYDROXYZINE HCL 50 MG PO TABS
50.0000 mg | ORAL_TABLET | Freq: Four times a day (QID) | ORAL | Status: DC | PRN
  Administered 2015-10-14 – 2015-10-15 (×3): 50 mg via ORAL
  Filled 2015-10-14 (×4): qty 1

## 2015-10-14 MED ORDER — TRAZODONE HCL 50 MG PO TABS
50.0000 mg | ORAL_TABLET | Freq: Once | ORAL | Status: AC
  Administered 2015-10-14: 50 mg via ORAL
  Filled 2015-10-14 (×2): qty 1

## 2015-10-14 NOTE — Progress Notes (Signed)
D.  Pt pleasant on approach, denies complaints at this time.  Positive for evening AA group, observed interacting appropriately with peers on the unit.  Pt denies SI/HI/hallucinations at this time.  Pt states she would like Trazodone ordered for sleep as it works well for her.  A.  Support and encouragement offered, medication given as ordered  R.  Pt remains safe on the unit, will continue to monitor.
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
## 2015-10-14 NOTE — [facility] Group Notes (Signed)
Adult Therapy Group Note  Date: 10/14/2015 Time: 10-11AM  Group Topic/Focus:  Today's group focused on introducing the concept of the impact of thoughts, feelings and behaviors on each other, and how it is thought that behavioral changes are the easiest to target, often leading to changes in feelings and thoughts. We evaluated excellent supports who would receive a score of A+ versus mediocre supports (C) and poor supports (F-). We then talked extensively about how to use healthy supports to increase healthy behaviors and reduce the unhealthy supports in order to be better protected against engaging in healthy behaviors.  Participation Level: Active  Participation Quality: Attentive, Sharing and Supportive  Affect: Blunted  Cognitive: Appropriate  Insight: Good  Engagement in Group: Engaged  Modes of Intervention: Clarification and Exploration  Additional Comments: [name] was very invested in the group today, and participated with insight and support for others as well as herself.  She told the group that she has moved around to various states hoping to get away from drugs, but "it seems to follow me."  She was able to acknowledge that she is the common denominator and that some healthy supports in her life are needed to help her to engage in healthy behaviors.  She is excited about the plan she has to go to long-term treatment then live in a halfway house.  [name], Indio Santilli Jo 10/14/2015, 1:02 PM

## 2015-10-14 NOTE — Progress Notes (Signed)
D- Patient addressed Clinical research associatewriter about her desire for sobriety and her plans for long term treatment.  Patient is preparing for discharge to a 28 day program and states that she is ready for this step, but she is also a bit anxious.  Patient was able to have a visit with her elderly mother and her son this shift.  Patient mood was bright and upbeat after the visit.  Patient denies SI, HI and AVH and expresses the desire for sobriety.   A- Assess patient for safety, offer medications as prescribed, engage patient in 1:1 staff talks.   R- Patient able to contract for safety, continue to monitor.

## 2015-10-14 NOTE — Progress Notes (Signed)
Psychoeducational Group Note  Date:  10/14/2015 Time: 0900 Group Topic/Focus:  Daily Goals Group:  The group focuses on teaching patients how to set daily attainable daily goals that will aid them in their recovery. Also discussed issues they want to review  When they see their practitioner today. Participation Level:  Active   Additional Comments:    [name] [name], [name] 10:37 AM. 10/14/2015

## 2015-10-15 MED ORDER — NITROGLYCERIN 0.4 MG SL SUBL: 0.4000 mg | SUBLINGUAL_TABLET | SUBLINGUAL | Status: DC | PRN

## 2015-10-15 MED ORDER — THIAMINE HCL 100 MG PO TABS: 100.0000 mg | ORAL_TABLET | Freq: Every day | ORAL | Status: DC

## 2015-10-15 MED ORDER — AMLODIPINE BESYLATE 10 MG PO TABS: 10.0000 mg | ORAL_TABLET | Freq: Every day | ORAL | Status: DC

## 2015-10-15 MED ORDER — MIRTAZAPINE 15 MG PO TABS: 15.0000 mg | ORAL_TABLET | Freq: Every day | ORAL | Status: DC

## 2015-10-15 MED ORDER — HYDROXYZINE HCL 50 MG PO TABS
50.0000 mg | ORAL_TABLET | Freq: Once | ORAL | Status: AC
  Administered 2015-10-15: 50 mg via ORAL
  Filled 2015-10-15: qty 1

## 2015-10-15 MED ORDER — TRAZODONE HCL 100 MG PO TABS: 100.0000 mg | ORAL_TABLET | Freq: Every evening | ORAL | Status: DC | PRN

## 2015-10-15 MED ORDER — HYDROXYZINE HCL 50 MG/ML IM SOLN: 50.0000 mg | Freq: Once | INTRAMUSCULAR | Status: DC

## 2015-10-15 MED ORDER — HYDROXYZINE HCL 50 MG PO TABS: 50.0000 mg | ORAL_TABLET | Freq: Four times a day (QID) | ORAL | Status: DC | PRN

## 2015-10-15 MED ORDER — AMOXICILLIN-POT CLAVULANATE 500-125 MG PO TABS: 1.0000 | ORAL_TABLET | Freq: Three times a day (TID) | ORAL | Status: DC

## 2015-10-15 NOTE — Progress Notes (Signed)
Patient did attend the evening speaker AA meeting.  

## 2015-10-15 NOTE — Progress Notes (Addendum)
Discharge instructions, prescriptions, SRA, and transition notes given to patient with verbalization of understanding; denies SI/HI and AVH; voices no concerns at time of discharge.  Discharged in stable condition with personnel from [facility].  Patient ambulatory upon discharge. All belongings returned to patient at time of discharge.
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
## 2015-10-15 NOTE — Progress Notes (Signed)
D: Pt was in the dayroom upon initial approach.  She reports her goal is to "stay positive."  Pt reports some of her family came to visit her earlier today.  She reports being thankful that the hospital was able to accommodate the needs of her family to visit outside of normal visiting hours.  Pt reports she feels excited and safe to discharge to [place] tomorrow.  Pt has depressed affect and mood.  Pt denies SI/HI, denies hallucinations, denies pain.  Pt has been visible in milieu interacting with peers and staff appropriately.  Pt attended evening group.   A: Introduced self to pt.  Met with pt and offered support and encouragement.  Medications administered per order.  PRN medication administered for anxiety.  On-call provider notified that pt requests Trazodone.  Trazodone 50 mg POX1 ordered and administered. R: Pt is compliant with medications.  Pt verbally contracts for safety.  Will continue to monitor and assess.   
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
## 2015-10-15 NOTE — Progress Notes (Signed)
  [facility] :  Will you be returning to the same living situation after discharge:  No.Pt accepted to [facility] for today.  At discharge, do you have transportation home?: Yes,  [facility] person pick up pt this afternoon (around 1pm). Do you have the ability to pay for your medications: Yes,  Private insurance  Release of information consent forms completed and submitted to medical records by CSW. Patient to Follow up at: Follow-up Information    Follow up with [facility] On 10/15/2015.   Why:  You have been accepted for admission on this date. Driver from facility will transport you after 1:00PM.    Contact information:   [address] [place] [address] Phone: [phone] Fax: [phone]      Next level of care provider has access to [facility] Link:no  Safety Planning and Suicide Prevention discussed: Yes,  SPE completed with pt; pt declined to consent to family contact. SPI pamphlet and Mobile Crisis information provided to pt. She was encouraged to share this information with her support network.   Have you used any form of tobacco in the last 30 days? (Cigarettes, Smokeless Tobacco, Cigars, and/or Pipes): Yes  Has patient been referred to the Quitline?: Patient refused referral  Patient has been referred for addiction treatment: Yes  Smart, Tyleigh Mahn LCSW 10/15/2015, 10:26 AM
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
## 2015-10-15 NOTE — Plan of Care (Signed)
Problem: Medication: Goal: Compliance with prescribed medication regimen will improve Outcome: Progressing Pt has been compliant with medications tonight.    

## 2015-10-15 NOTE — Progress Notes (Addendum)
D:  Patient awake, alert and oriented; visible in dayroom this morning; interacting with peers.  She denies suicidal and homicidal ideation and AVH;  Affect flat;  Mood depressed. She says she did not sleep well last night.  She also complains of Anxiety and Heartburn No self-injurious behaviors noted or reported  A: Encouraged to seek assistance with needs/concerns; medications give as scheduled; Given prn medication for anxiety and heartburn (see MAR); She states she is going to try to stay positive. R:  Safety maintained on unit;  Remains on q 15 minute safety checks.

## 2015-10-15 NOTE — Progress Notes (Signed)
Recreation Therapy Notes  Date: 06.26.2017 Time: 9:30am Location: [address] Group Room   Group Topic: Stress Management  Goal Area(s) Addresses:  Patient will actively participate in stress management techniques presented during session.   Behavioral Response: Did not attend.   Lisandro Meggett L Byron Peacock, LRT/CTRS        Wong Steadham L 10/15/2015 10:17 AM 

## 2015-10-15 NOTE — Plan of Care (Signed)
Problem: Activity: Goal: Interest or engagement in leisure activities will improve Outcome: Progressing Visible in dayroom; interacting with peers  Problem: Coping: Goal: Ability to cope will improve Outcome: Progressing Denies SI/HI

## 2015-10-16 NOTE — Progress Notes (Signed)
Patient ID: Janelle FloorDonna K Friedmann, female   DOB: May 12, 1955, 60 y.o.   MRN: [id number]   [facility], reports that pt has arrived at their facility for treatment. Earl ManySara Twyman, RN 10/16/2015 6:54 PM

## 2015-10-25 ENCOUNTER — Ambulatory Visit (HOSPITAL_COMMUNITY)
Admission: RE | Admit: 2015-10-25 | Payer: BLUE CROSS/BLUE SHIELD | Source: Ambulatory Visit | Admitting: Internal Medicine

## 2015-10-25 ENCOUNTER — Encounter (HOSPITAL_COMMUNITY): Admission: RE | Payer: Self-pay | Source: Ambulatory Visit

## 2015-10-25 SURGERY — COLONOSCOPY
Anesthesia: Moderate Sedation

## 2015-11-29 NOTE — Congregational Nurse Program (Signed)
Client had visited the ER for abscess tooth given antibiotic but was in need of dental assistance. Referrel made to [facility]

## 2015-11-29 NOTE — Congregational Nurse Program (Unsigned)
Congregational Nurse Program Note  Date of Encounter: 06/13/2015  Past Medical History: Past Medical History:  Diagnosis Date  . Anxiety   . GERD (gastroesophageal reflux disease)   . Hep C w/o coma, chronic (HCC)   . Hyperlipidemia   . Hypertension   . Migraine   . Renal disorder   . Sciatica     Encounter Details:  Client was seen at ER for abscess tooth and given an antibiotic. Client was referred to [facility] for dental issues.  Hewitt ShortsJan Dazaria Macneill, RN

## 2015-11-29 NOTE — Congregational Nurse Program (Deleted)
Congregational Nurse Program Note  Date of Encounter: 11/29/2015  Past Medical History: Past Medical History:  Diagnosis Date  . Anxiety   . GERD (gastroesophageal reflux disease)   . Hep C w/o coma, chronic (HCC)   . Hyperlipidemia   . Hypertension   . Migraine   . Renal disorder   . Sciatica     Encounter Details:

## 2015-12-07 NOTE — Congregational Nurse Program (Signed)
Congregational Nurse Program Note  Date of Encounter: 06/14/2015  Past Medical History: Past Medical History:  Diagnosis Date  . Anxiety   . GERD (gastroesophageal reflux disease)   . Hep C w/o coma, chronic (HCC)   . Hyperlipidemia   . Hypertension   . Migraine   . Renal disorder   . Sciatica     Encounter Details:   Assisted client with making appointment at [facility]. No resources available for abscess tooth. No referral was made, clinic in another county.  Hewitt ShortsJan Caitland Porchia RN CNP [phone]

## 2015-12-31 NOTE — Progress Notes (Unsigned)
This encounter was created in error - please disregard.

## 2015-12-31 NOTE — Progress Notes (Signed)
This encounter was created in error - please disregard.

## 2016-01-11 ENCOUNTER — Emergency Department (HOSPITAL_COMMUNITY)
Admission: EM | Admit: 2016-01-11 | Discharge: 2016-01-11 | Disposition: A | Discharge status: Home / Self Care | Payer: BLUE CROSS/BLUE SHIELD | Attending: Emergency Medicine | Admitting: Emergency Medicine

## 2016-01-11 ENCOUNTER — Encounter (HOSPITAL_COMMUNITY): Payer: Self-pay | Admitting: *Deleted

## 2016-01-11 DIAGNOSIS — I10 Essential (primary) hypertension: Secondary | ICD-10-CM | POA: Diagnosis not present

## 2016-01-11 DIAGNOSIS — G43001 Migraine without aura, not intractable, with status migrainosus: Secondary | ICD-10-CM | POA: Diagnosis not present

## 2016-01-11 DIAGNOSIS — Z7982 Long term (current) use of aspirin: Secondary | ICD-10-CM | POA: Diagnosis not present

## 2016-01-11 DIAGNOSIS — Z79899 Other long term (current) drug therapy: Secondary | ICD-10-CM | POA: Diagnosis not present

## 2016-01-11 DIAGNOSIS — R51 Headache: Secondary | ICD-10-CM | POA: Diagnosis present

## 2016-01-11 DIAGNOSIS — F17210 Nicotine dependence, cigarettes, uncomplicated: Secondary | ICD-10-CM | POA: Diagnosis not present

## 2016-01-11 LAB — COMPREHENSIVE METABOLIC PANEL
ALT: 28 U/L (ref 14–54)
AST: 22 U/L (ref 15–41)
Albumin: 4.1 g/dL (ref 3.5–5.0)
Alkaline Phosphatase: 106 U/L (ref 38–126)
Anion gap: 8 (ref 5–15)
BUN: 11 mg/dL (ref 6–20)
CO2: 27 mmol/L (ref 22–32)
Calcium: 9.4 mg/dL (ref 8.9–10.3)
Chloride: 103 mmol/L (ref 101–111)
Creatinine, Ser: 0.78 mg/dL (ref 0.44–1.00)
GFR calc Af Amer: >60 mL/min (ref >60)
GFR calc non Af Amer: >60 mL/min (ref >60)
Glucose, Bld: 119 mg/dL — ABNORMAL HIGH (ref 65–99)
Potassium: 3.2 mmol/L — ABNORMAL LOW (ref 3.5–5.1)
Sodium: 138 mmol/L (ref 135–145)
Total Bilirubin: 0.1 mg/dL — ABNORMAL LOW (ref 0.3–1.2)
Total Protein: 7.3 g/dL (ref 6.5–8.1)

## 2016-01-11 LAB — CBC
HCT: 36.9 % (ref 36.0–46.0)
Hemoglobin: 12.2 g/dL (ref 12.0–15.0)
MCH: 29.5 pg (ref 26.0–34.0)
MCHC: 33.1 g/dL (ref 30.0–36.0)
MCV: 89.3 fL (ref 78.0–100.0)
Platelets: 265 10*3/uL (ref 150–400)
RBC: 4.13 MIL/uL (ref 3.87–5.11)
RDW: 14.2 % (ref 11.5–15.5)
WBC: 5.8 10*3/uL (ref 4.0–10.5)

## 2016-01-11 LAB — LIPASE, BLOOD: Lipase: 15 U/L (ref 11–51)

## 2016-01-11 LAB — URINALYSIS, ROUTINE W REFLEX MICROSCOPIC
Bilirubin Urine: NEGATIVE
Glucose, UA: NEGATIVE mg/dL
Hgb urine dipstick: NEGATIVE
Leukocytes, UA: NEGATIVE
Nitrite: NEGATIVE
Protein, ur: NEGATIVE mg/dL
Specific Gravity, Urine: >1.03 — ABNORMAL HIGH (ref 1.005–1.030)
pH: 6 (ref 5.0–8.0)

## 2016-01-11 MED ORDER — BUTALBITAL-APAP-CAFFEINE 50-325-40 MG PO TABS: 1.0000 | ORAL_TABLET | Freq: Four times a day (QID) | ORAL | 0 refills | Status: DC | PRN

## 2016-01-11 MED ORDER — METOCLOPRAMIDE HCL 5 MG/ML IJ SOLN
10.0000 mg | Freq: Once | INTRAMUSCULAR | Status: AC
  Administered 2016-01-11: 10 mg via INTRAVENOUS
  Filled 2016-01-11: qty 2

## 2016-01-11 MED ORDER — KETOROLAC TROMETHAMINE 30 MG/ML IJ SOLN
30.0000 mg | Freq: Once | INTRAMUSCULAR | Status: AC
  Administered 2016-01-11: 30 mg via INTRAVENOUS
  Filled 2016-01-11: qty 1

## 2016-01-11 MED ORDER — DIPHENHYDRAMINE HCL 50 MG/ML IJ SOLN
25.0000 mg | Freq: Once | INTRAMUSCULAR | Status: AC
  Administered 2016-01-11: 25 mg via INTRAVENOUS
  Filled 2016-01-11: qty 1

## 2016-01-11 MED ORDER — POTASSIUM CHLORIDE CRYS ER 20 MEQ PO TBCR
40.0000 meq | EXTENDED_RELEASE_TABLET | Freq: Once | ORAL | Status: AC
  Administered 2016-01-11: 40 meq via ORAL
  Filled 2016-01-11: qty 2

## 2016-01-11 MED ORDER — ONDANSETRON 4 MG PO TBDP: ORAL_TABLET | ORAL | 0 refills | Status: DC

## 2016-01-11 MED ORDER — SODIUM CHLORIDE 0.9 % IV BOLUS (SEPSIS)
1000.0000 mL | Freq: Once | INTRAVENOUS | Status: AC
  Administered 2016-01-11: 1000 mL via INTRAVENOUS

## 2016-01-11 NOTE — ED Notes (Signed)
Non non photophobic, ambulates heel to toe without stagger or drift to room and speaks in complete sentences without shortness of breath or hesitation
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
## 2016-01-11 NOTE — Discharge Instructions (Signed)
Follow up with your md if not improving. °

## 2016-01-11 NOTE — ED Triage Notes (Signed)
Pt comes in with a headache starting on Tuesday. She hasn't been able to control this headache with medication like in the past. She began having vomiting and diarrhea yesterday. NAD noted.

## 2016-01-29 ENCOUNTER — Emergency Department (HOSPITAL_COMMUNITY)
Admission: EM | Admit: 2016-01-29 | Discharge: 2016-01-29 | Disposition: A | Discharge status: Home / Self Care | Payer: BLUE CROSS/BLUE SHIELD | Attending: Emergency Medicine | Admitting: Emergency Medicine

## 2016-01-29 ENCOUNTER — Encounter (HOSPITAL_COMMUNITY): Payer: Self-pay | Admitting: Emergency Medicine

## 2016-01-29 DIAGNOSIS — M25512 Pain in left shoulder: Secondary | ICD-10-CM | POA: Insufficient documentation

## 2016-01-29 DIAGNOSIS — F17210 Nicotine dependence, cigarettes, uncomplicated: Secondary | ICD-10-CM | POA: Insufficient documentation

## 2016-01-29 DIAGNOSIS — R51 Headache: Secondary | ICD-10-CM | POA: Diagnosis not present

## 2016-01-29 DIAGNOSIS — R519 Headache, unspecified: Secondary | ICD-10-CM

## 2016-01-29 DIAGNOSIS — Z79899 Other long term (current) drug therapy: Secondary | ICD-10-CM | POA: Diagnosis not present

## 2016-01-29 DIAGNOSIS — M25511 Pain in right shoulder: Secondary | ICD-10-CM | POA: Diagnosis not present

## 2016-01-29 DIAGNOSIS — I10 Essential (primary) hypertension: Secondary | ICD-10-CM | POA: Diagnosis not present

## 2016-01-29 DIAGNOSIS — G43909 Migraine, unspecified, not intractable, without status migrainosus: Secondary | ICD-10-CM | POA: Diagnosis present

## 2016-01-29 MED ORDER — KETOROLAC TROMETHAMINE 30 MG/ML IJ SOLN
10.0000 mg | Freq: Once | INTRAMUSCULAR | Status: AC
  Administered 2016-01-29: 9.9 mg via INTRAMUSCULAR
  Filled 2016-01-29: qty 1

## 2016-01-29 MED ORDER — PROCHLORPERAZINE EDISYLATE 5 MG/ML IJ SOLN
10.0000 mg | Freq: Once | INTRAMUSCULAR | Status: AC
  Administered 2016-01-29: 10 mg via INTRAMUSCULAR
  Filled 2016-01-29: qty 2

## 2016-01-29 MED ORDER — BUTALBITAL-APAP-CAFFEINE 50-325-40 MG PO TABS: 1.0000 | ORAL_TABLET | Freq: Four times a day (QID) | ORAL | 0 refills | Status: DC | PRN
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
## 2016-01-29 NOTE — Discharge Instructions (Signed)
As discussed, your evaluation today has been largely reassuring.  But, it is important that you monitor your condition carefully, and do not hesitate to return to the ED if you develop new, or concerning changes in your condition. ? ?Otherwise, please follow-up with your physician for appropriate ongoing care. ? ?

## 2016-01-29 NOTE — ED Notes (Signed)
Pt moved from room 16 to room 5. Pt and visitor walked out of room out of ED. Returned in 5 minutes back into room 5.

## 2016-01-29 NOTE — ED Triage Notes (Signed)
Pt states she is having L shoulder pain, chronic. Pt c/o headache with emesis that started 4 days ago and states when she got out of bed last night her "back went out."

## 2016-04-14 ENCOUNTER — Encounter (HOSPITAL_COMMUNITY): Payer: Self-pay | Admitting: *Deleted

## 2016-04-14 ENCOUNTER — Emergency Department (HOSPITAL_COMMUNITY)
Admission: EM | Admit: 2016-04-14 | Discharge: 2016-04-14 | Disposition: A | Discharge status: Home / Self Care | Payer: BLUE CROSS/BLUE SHIELD | Attending: Emergency Medicine | Admitting: Emergency Medicine

## 2016-04-14 DIAGNOSIS — K0889 Other specified disorders of teeth and supporting structures: Secondary | ICD-10-CM | POA: Diagnosis not present

## 2016-04-14 DIAGNOSIS — I10 Essential (primary) hypertension: Secondary | ICD-10-CM | POA: Diagnosis not present

## 2016-04-14 DIAGNOSIS — Z79899 Other long term (current) drug therapy: Secondary | ICD-10-CM | POA: Diagnosis not present

## 2016-04-14 DIAGNOSIS — M545 Low back pain: Secondary | ICD-10-CM | POA: Diagnosis present

## 2016-04-14 DIAGNOSIS — M5442 Lumbago with sciatica, left side: Secondary | ICD-10-CM

## 2016-04-14 DIAGNOSIS — F17210 Nicotine dependence, cigarettes, uncomplicated: Secondary | ICD-10-CM | POA: Insufficient documentation

## 2016-04-14 LAB — URINALYSIS, ROUTINE W REFLEX MICROSCOPIC
Bilirubin Urine: NEGATIVE
Glucose, UA: NEGATIVE mg/dL
Hgb urine dipstick: NEGATIVE
Ketones, ur: NEGATIVE mg/dL
Leukocytes, UA: NEGATIVE
Nitrite: NEGATIVE
Protein, ur: NEGATIVE mg/dL
Specific Gravity, Urine: 1.01 (ref 1.005–1.030)
pH: 6 (ref 5.0–8.0)

## 2016-04-14 MED ORDER — PREDNISONE 20 MG PO TABS: ORAL_TABLET | ORAL | 0 refills | Status: DC

## 2016-04-14 MED ORDER — PENICILLIN V POTASSIUM 500 MG PO TABS: 500.0000 mg | ORAL_TABLET | Freq: Four times a day (QID) | ORAL | 0 refills | Status: AC

## 2016-04-14 MED ORDER — CYCLOBENZAPRINE HCL 10 MG PO TABS
10.0000 mg | ORAL_TABLET | Freq: Once | ORAL | Status: AC
  Administered 2016-04-14: 10 mg via ORAL
  Filled 2016-04-14: qty 1

## 2016-04-14 MED ORDER — DEXAMETHASONE SODIUM PHOSPHATE 10 MG/ML IJ SOLN
10.0000 mg | Freq: Once | INTRAMUSCULAR | Status: AC
  Administered 2016-04-14: 10 mg via INTRAMUSCULAR
  Filled 2016-04-14: qty 1

## 2016-04-14 MED ORDER — NAPROXEN 250 MG PO TABS: ORAL_TABLET | ORAL | 0 refills | Status: DC

## 2016-04-14 MED ORDER — CYCLOBENZAPRINE HCL 5 MG PO TABS: 5.0000 mg | ORAL_TABLET | Freq: Three times a day (TID) | ORAL | 0 refills | Status: DC | PRN

## 2016-04-14 MED ORDER — FAMOTIDINE 20 MG PO TABS: 20.0000 mg | ORAL_TABLET | Freq: Two times a day (BID) | ORAL | 0 refills | Status: DC

## 2016-04-14 NOTE — ED Notes (Signed)
Pt requesting a work note, antibiotic for tooth pain & pain medication to take home.

## 2016-04-14 NOTE — Discharge Instructions (Signed)
Take the medications as prescribed. Use ice and heat for comfort. Recheck by your doctor if not improving over the next week. Take the antibiotic for your tooth. You will need to see a dentist for definitive care of your tooth.

## 2016-04-14 NOTE — ED Triage Notes (Signed)
Pt arrived by EMS from home. Complaining of flank & back pain. Pt w/ hx of stones.
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
## 2016-10-26 ENCOUNTER — Emergency Department (HOSPITAL_COMMUNITY): Payer: BLUE CROSS/BLUE SHIELD

## 2016-10-26 ENCOUNTER — Encounter (HOSPITAL_COMMUNITY): Payer: Self-pay | Admitting: *Deleted

## 2016-10-26 ENCOUNTER — Inpatient Hospital Stay (HOSPITAL_COMMUNITY)
Admission: EM | Admit: 2016-10-26 | Discharge: 2016-10-28 | DRG: 291 | Disposition: A | Discharge status: Home / Self Care | Payer: BLUE CROSS/BLUE SHIELD | Attending: Family Medicine | Admitting: Family Medicine

## 2016-10-26 DIAGNOSIS — R0602 Shortness of breath: Secondary | ICD-10-CM | POA: Diagnosis not present

## 2016-10-26 DIAGNOSIS — E785 Hyperlipidemia, unspecified: Secondary | ICD-10-CM | POA: Diagnosis present

## 2016-10-26 DIAGNOSIS — I110 Hypertensive heart disease with heart failure: Secondary | ICD-10-CM | POA: Diagnosis not present

## 2016-10-26 DIAGNOSIS — F17210 Nicotine dependence, cigarettes, uncomplicated: Secondary | ICD-10-CM | POA: Diagnosis present

## 2016-10-26 DIAGNOSIS — F1910 Other psychoactive substance abuse, uncomplicated: Secondary | ICD-10-CM | POA: Diagnosis present

## 2016-10-26 DIAGNOSIS — F1320 Sedative, hypnotic or anxiolytic dependence, uncomplicated: Secondary | ICD-10-CM | POA: Diagnosis present

## 2016-10-26 DIAGNOSIS — R791 Abnormal coagulation profile: Secondary | ICD-10-CM | POA: Diagnosis present

## 2016-10-26 DIAGNOSIS — Z885 Allergy status to narcotic agent status: Secondary | ICD-10-CM

## 2016-10-26 DIAGNOSIS — R7989 Other specified abnormal findings of blood chemistry: Secondary | ICD-10-CM

## 2016-10-26 DIAGNOSIS — Z7952 Long term (current) use of systemic steroids: Secondary | ICD-10-CM

## 2016-10-26 DIAGNOSIS — I2510 Atherosclerotic heart disease of native coronary artery without angina pectoris: Secondary | ICD-10-CM | POA: Diagnosis present

## 2016-10-26 DIAGNOSIS — K219 Gastro-esophageal reflux disease without esophagitis: Secondary | ICD-10-CM | POA: Diagnosis present

## 2016-10-26 DIAGNOSIS — Z841 Family history of disorders of kidney and ureter: Secondary | ICD-10-CM

## 2016-10-26 DIAGNOSIS — I509 Heart failure, unspecified: Secondary | ICD-10-CM

## 2016-10-26 DIAGNOSIS — Z955 Presence of coronary angioplasty implant and graft: Secondary | ICD-10-CM

## 2016-10-26 DIAGNOSIS — Z91041 Radiographic dye allergy status: Secondary | ICD-10-CM

## 2016-10-26 DIAGNOSIS — E876 Hypokalemia: Secondary | ICD-10-CM | POA: Diagnosis present

## 2016-10-26 DIAGNOSIS — Z8249 Family history of ischemic heart disease and other diseases of the circulatory system: Secondary | ICD-10-CM

## 2016-10-26 DIAGNOSIS — I252 Old myocardial infarction: Secondary | ICD-10-CM

## 2016-10-26 DIAGNOSIS — J9602 Acute respiratory failure with hypercapnia: Secondary | ICD-10-CM | POA: Diagnosis present

## 2016-10-26 DIAGNOSIS — E872 Acidosis: Secondary | ICD-10-CM | POA: Diagnosis present

## 2016-10-26 DIAGNOSIS — I5031 Acute diastolic (congestive) heart failure: Secondary | ICD-10-CM | POA: Diagnosis present

## 2016-10-26 DIAGNOSIS — J9601 Acute respiratory failure with hypoxia: Secondary | ICD-10-CM

## 2016-10-26 DIAGNOSIS — B182 Chronic viral hepatitis C: Secondary | ICD-10-CM | POA: Diagnosis present

## 2016-10-26 DIAGNOSIS — I5033 Acute on chronic diastolic (congestive) heart failure: Secondary | ICD-10-CM | POA: Diagnosis present

## 2016-10-26 DIAGNOSIS — Z79899 Other long term (current) drug therapy: Secondary | ICD-10-CM

## 2016-10-26 DIAGNOSIS — Z7982 Long term (current) use of aspirin: Secondary | ICD-10-CM

## 2016-10-26 DIAGNOSIS — J449 Chronic obstructive pulmonary disease, unspecified: Secondary | ICD-10-CM | POA: Diagnosis present

## 2016-10-26 LAB — CBC WITH DIFFERENTIAL/PLATELET
Basophils Absolute: 0 10*3/uL (ref 0.0–0.1)
Basophils Relative: 0 %
Eosinophils Absolute: 0.2 10*3/uL (ref 0.0–0.7)
Eosinophils Relative: 3 %
HCT: 32.2 % — ABNORMAL LOW (ref 36.0–46.0)
Hemoglobin: 10 g/dL — ABNORMAL LOW (ref 12.0–15.0)
Lymphocytes Relative: 9 %
Lymphs Abs: 0.7 10*3/uL (ref 0.7–4.0)
MCH: 27.2 pg (ref 26.0–34.0)
MCHC: 31.1 g/dL (ref 30.0–36.0)
MCV: 87.5 fL (ref 78.0–100.0)
Monocytes Absolute: 0.4 10*3/uL (ref 0.1–1.0)
Monocytes Relative: 5 %
Neutro Abs: 7 10*3/uL (ref 1.7–7.7)
Neutrophils Relative %: 83 %
Platelets: 282 10*3/uL (ref 150–400)
RBC: 3.68 MIL/uL — ABNORMAL LOW (ref 3.87–5.11)
RDW: 15 % (ref 11.5–15.5)
WBC: 8.4 10*3/uL (ref 4.0–10.5)

## 2016-10-26 LAB — BLOOD GAS, ARTERIAL
Acid-Base Excess: 2.1 mmol/L — ABNORMAL HIGH (ref 0.0–2.0)
Bicarbonate: 25.5 mmol/L (ref 20.0–28.0)
Drawn by: 22223
O2 Content: 4 L/min
O2 Saturation: 94.3 %
pCO2 arterial: 57.2 mmHg — ABNORMAL HIGH (ref 32.0–48.0)
pH, Arterial: 7.308 — ABNORMAL LOW (ref 7.350–7.450)
pO2, Arterial: 76.4 mmHg — ABNORMAL LOW (ref 83.0–108.0)

## 2016-10-26 LAB — COMPREHENSIVE METABOLIC PANEL
ALT: 9 U/L — ABNORMAL LOW (ref 14–54)
AST: 12 U/L — ABNORMAL LOW (ref 15–41)
Albumin: 3.1 g/dL — ABNORMAL LOW (ref 3.5–5.0)
Alkaline Phosphatase: 87 U/L (ref 38–126)
Anion gap: 7 (ref 5–15)
BUN: 10 mg/dL (ref 6–20)
CO2: 29 mmol/L (ref 22–32)
Calcium: 9 mg/dL (ref 8.9–10.3)
Chloride: 105 mmol/L (ref 101–111)
Creatinine, Ser: 0.6 mg/dL (ref 0.44–1.00)
GFR calc Af Amer: >60 mL/min (ref >60)
GFR calc non Af Amer: >60 mL/min (ref >60)
Glucose, Bld: 125 mg/dL — ABNORMAL HIGH (ref 65–99)
Potassium: 3.7 mmol/L (ref 3.5–5.1)
Sodium: 141 mmol/L (ref 135–145)
Total Bilirubin: 0.3 mg/dL (ref 0.3–1.2)
Total Protein: 6.7 g/dL (ref 6.5–8.1)

## 2016-10-26 LAB — RAPID URINE DRUG SCREEN, HOSP PERFORMED
Amphetamines: NOT DETECTED
Barbiturates: NOT DETECTED
Benzodiazepines: POSITIVE — AB
Cocaine: NOT DETECTED
Opiates: NOT DETECTED
Tetrahydrocannabinol: NOT DETECTED

## 2016-10-26 LAB — D-DIMER, QUANTITATIVE: D-Dimer, Quant: 1.16 ug/mL-FEU — ABNORMAL HIGH (ref 0.00–0.50)

## 2016-10-26 LAB — PROTIME-INR
INR: 1.03
Prothrombin Time: 13.5 seconds (ref 11.4–15.2)

## 2016-10-26 LAB — TROPONIN I: Troponin I: <0.03 ng/mL (ref <0.03)

## 2016-10-26 LAB — BRAIN NATRIURETIC PEPTIDE: B Natriuretic Peptide: 1259 pg/mL — ABNORMAL HIGH (ref 0.0–100.0)

## 2016-10-26 MED ORDER — FUROSEMIDE 10 MG/ML IJ SOLN
40.0000 mg | INTRAMUSCULAR | Status: AC
  Administered 2016-10-27: 40 mg via INTRAVENOUS
  Filled 2016-10-26: qty 4

## 2016-10-26 NOTE — ED Notes (Signed)
Pt encouraged to urinate- She reports that she does not have to urinate at this time. She is offered an in and out cath should she prefer and pt states she prefers to try to go herself

## 2016-10-26 NOTE — ED Notes (Signed)
In and out cath per son's request with little response from pt who is resting with eyes closed

## 2016-10-26 NOTE — ED Notes (Signed)
Pt was hypertensive on scene with ems (systolic 190"s) and given one SL nitro with decrease in blood pressure to systolic of 150"s,

## 2016-10-26 NOTE — ED Notes (Signed)
[place] at 4l returned O2 sat of 95 per cent  O2 titrated down to 3L
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
## 2016-10-26 NOTE — ED Triage Notes (Addendum)
Pt c/o sob that started this afternoon, room air pulse ox with ems was 89%, pt placed on NRB with increase in pulse ox to 99%. Pt also states that her abd and lower extremities are swollen, worse on right lower leg than left,

## 2016-10-26 NOTE — ED Notes (Signed)
Pt stated she does not need to urinate right now, aware of DO

## 2016-10-26 NOTE — ED Notes (Signed)
Pt son and caregiver at bedside-   Dr Hyacinth MeekerMiller aware and will speak with them regarding plan of care

## 2016-10-26 NOTE — ED Notes (Signed)
Pt is by ems with dyspnea  She is lethargic and pale- her work of breathing is easy but shallow. LS are diminished - in on 15l, but pt is a cig smoker - pt is not on home O2  [facility]

## 2016-10-27 ENCOUNTER — Observation Stay (HOSPITAL_COMMUNITY): Payer: BLUE CROSS/BLUE SHIELD

## 2016-10-27 ENCOUNTER — Encounter (HOSPITAL_COMMUNITY): Payer: Self-pay

## 2016-10-27 DIAGNOSIS — J449 Chronic obstructive pulmonary disease, unspecified: Secondary | ICD-10-CM | POA: Diagnosis present

## 2016-10-27 DIAGNOSIS — R0602 Shortness of breath: Secondary | ICD-10-CM | POA: Diagnosis present

## 2016-10-27 DIAGNOSIS — J9601 Acute respiratory failure with hypoxia: Secondary | ICD-10-CM | POA: Diagnosis present

## 2016-10-27 DIAGNOSIS — I5033 Acute on chronic diastolic (congestive) heart failure: Secondary | ICD-10-CM | POA: Diagnosis present

## 2016-10-27 DIAGNOSIS — Z91041 Radiographic dye allergy status: Secondary | ICD-10-CM | POA: Diagnosis not present

## 2016-10-27 DIAGNOSIS — E872 Acidosis: Secondary | ICD-10-CM | POA: Diagnosis present

## 2016-10-27 DIAGNOSIS — I2510 Atherosclerotic heart disease of native coronary artery without angina pectoris: Secondary | ICD-10-CM | POA: Diagnosis present

## 2016-10-27 DIAGNOSIS — Z955 Presence of coronary angioplasty implant and graft: Secondary | ICD-10-CM | POA: Diagnosis not present

## 2016-10-27 DIAGNOSIS — K219 Gastro-esophageal reflux disease without esophagitis: Secondary | ICD-10-CM | POA: Diagnosis present

## 2016-10-27 DIAGNOSIS — Z885 Allergy status to narcotic agent status: Secondary | ICD-10-CM | POA: Diagnosis not present

## 2016-10-27 DIAGNOSIS — Z8249 Family history of ischemic heart disease and other diseases of the circulatory system: Secondary | ICD-10-CM | POA: Diagnosis not present

## 2016-10-27 DIAGNOSIS — E785 Hyperlipidemia, unspecified: Secondary | ICD-10-CM | POA: Diagnosis present

## 2016-10-27 DIAGNOSIS — J9602 Acute respiratory failure with hypercapnia: Secondary | ICD-10-CM | POA: Diagnosis present

## 2016-10-27 DIAGNOSIS — F17210 Nicotine dependence, cigarettes, uncomplicated: Secondary | ICD-10-CM | POA: Diagnosis present

## 2016-10-27 DIAGNOSIS — Z7982 Long term (current) use of aspirin: Secondary | ICD-10-CM | POA: Diagnosis not present

## 2016-10-27 DIAGNOSIS — I110 Hypertensive heart disease with heart failure: Secondary | ICD-10-CM | POA: Diagnosis present

## 2016-10-27 DIAGNOSIS — Z841 Family history of disorders of kidney and ureter: Secondary | ICD-10-CM | POA: Diagnosis not present

## 2016-10-27 DIAGNOSIS — R791 Abnormal coagulation profile: Secondary | ICD-10-CM | POA: Diagnosis present

## 2016-10-27 DIAGNOSIS — E876 Hypokalemia: Secondary | ICD-10-CM | POA: Diagnosis present

## 2016-10-27 DIAGNOSIS — I5031 Acute diastolic (congestive) heart failure: Secondary | ICD-10-CM | POA: Diagnosis present

## 2016-10-27 DIAGNOSIS — B182 Chronic viral hepatitis C: Secondary | ICD-10-CM | POA: Diagnosis present

## 2016-10-27 DIAGNOSIS — Z79899 Other long term (current) drug therapy: Secondary | ICD-10-CM | POA: Diagnosis not present

## 2016-10-27 DIAGNOSIS — Z7952 Long term (current) use of systemic steroids: Secondary | ICD-10-CM | POA: Diagnosis not present

## 2016-10-27 DIAGNOSIS — F1320 Sedative, hypnotic or anxiolytic dependence, uncomplicated: Secondary | ICD-10-CM | POA: Diagnosis present

## 2016-10-27 DIAGNOSIS — I252 Old myocardial infarction: Secondary | ICD-10-CM | POA: Diagnosis not present

## 2016-10-27 LAB — BASIC METABOLIC PANEL
Anion gap: 9 (ref 5–15)
BUN: 10 mg/dL (ref 6–20)
CO2: 32 mmol/L (ref 22–32)
Calcium: 9.2 mg/dL (ref 8.9–10.3)
Chloride: 98 mmol/L — ABNORMAL LOW (ref 101–111)
Creatinine, Ser: 0.54 mg/dL (ref 0.44–1.00)
GFR calc Af Amer: >60 mL/min (ref >60)
GFR calc non Af Amer: >60 mL/min (ref >60)
Glucose, Bld: 117 mg/dL — ABNORMAL HIGH (ref 65–99)
Potassium: 3.9 mmol/L (ref 3.5–5.1)
Sodium: 139 mmol/L (ref 135–145)

## 2016-10-27 LAB — MRSA PCR SCREENING: MRSA by PCR: NEGATIVE

## 2016-10-27 LAB — MAGNESIUM: Magnesium: 1.7 mg/dL (ref 1.7–2.4)

## 2016-10-27 LAB — TROPONIN I: Troponin I: <0.03 ng/mL (ref <0.03)

## 2016-10-27 MED ORDER — SODIUM CHLORIDE 0.9% FLUSH
3.0000 mL | Freq: Two times a day (BID) | INTRAVENOUS | Status: DC
  Administered 2016-10-27 – 2016-10-28 (×2): 3 mL via INTRAVENOUS

## 2016-10-27 MED ORDER — NITROGLYCERIN 0.4 MG SL SUBL: 0.4000 mg | SUBLINGUAL_TABLET | SUBLINGUAL | Status: DC | PRN

## 2016-10-27 MED ORDER — FAMOTIDINE 20 MG PO TABS
20.0000 mg | ORAL_TABLET | Freq: Two times a day (BID) | ORAL | Status: DC
  Administered 2016-10-27 – 2016-10-28 (×4): 20 mg via ORAL
  Filled 2016-10-27 (×4): qty 1

## 2016-10-27 MED ORDER — HYDRALAZINE HCL 20 MG/ML IJ SOLN: 10.0000 mg | INTRAMUSCULAR | Status: DC | PRN

## 2016-10-27 MED ORDER — SODIUM CHLORIDE 0.9% FLUSH
3.0000 mL | Freq: Two times a day (BID) | INTRAVENOUS | Status: DC
  Administered 2016-10-27 – 2016-10-28 (×3): 3 mL via INTRAVENOUS

## 2016-10-27 MED ORDER — CARVEDILOL 3.125 MG PO TABS
3.1250 mg | ORAL_TABLET | Freq: Two times a day (BID) | ORAL | Status: DC
  Administered 2016-10-27 – 2016-10-28 (×3): 3.125 mg via ORAL
  Filled 2016-10-27 (×3): qty 1

## 2016-10-27 MED ORDER — TRAZODONE HCL 50 MG PO TABS: 100.0000 mg | ORAL_TABLET | Freq: Every evening | ORAL | Status: DC | PRN

## 2016-10-27 MED ORDER — SODIUM CHLORIDE 0.9% FLUSH: 3.0000 mL | INTRAVENOUS | Status: DC | PRN

## 2016-10-27 MED ORDER — ASPIRIN EC 81 MG PO TBEC
81.0000 mg | DELAYED_RELEASE_TABLET | Freq: Every day | ORAL | Status: DC
  Administered 2016-10-27 – 2016-10-28 (×2): 81 mg via ORAL
  Filled 2016-10-27 (×2): qty 1

## 2016-10-27 MED ORDER — NICOTINE 21 MG/24HR TD PT24
21.0000 mg | MEDICATED_PATCH | Freq: Every day | TRANSDERMAL | Status: DC
  Filled 2016-10-27 (×2): qty 1

## 2016-10-27 MED ORDER — ATORVASTATIN CALCIUM 40 MG PO TABS
80.0000 mg | ORAL_TABLET | Freq: Every day | ORAL | Status: DC
  Administered 2016-10-27: 80 mg via ORAL
  Filled 2016-10-27: qty 2

## 2016-10-27 MED ORDER — ACETAMINOPHEN 325 MG PO TABS
650.0000 mg | ORAL_TABLET | ORAL | Status: DC | PRN
  Administered 2016-10-27 – 2016-10-28 (×2): 650 mg via ORAL
  Filled 2016-10-27 (×2): qty 2

## 2016-10-27 MED ORDER — ONDANSETRON 4 MG PO TBDP: 4.0000 mg | ORAL_TABLET | Freq: Three times a day (TID) | ORAL | Status: DC | PRN

## 2016-10-27 MED ORDER — CLOPIDOGREL BISULFATE 75 MG PO TABS
75.0000 mg | ORAL_TABLET | Freq: Every day | ORAL | Status: DC
  Administered 2016-10-27 – 2016-10-28 (×2): 75 mg via ORAL
  Filled 2016-10-27 (×2): qty 1

## 2016-10-27 MED ORDER — TRAMADOL HCL 50 MG PO TABS: 100.0000 mg | ORAL_TABLET | Freq: Four times a day (QID) | ORAL | Status: DC | PRN

## 2016-10-27 MED ORDER — TECHNETIUM TO 99M ALBUMIN AGGREGATED
4.0000 | Freq: Once | INTRAVENOUS | Status: AC | PRN
  Administered 2016-10-27: 4.6 via INTRAVENOUS

## 2016-10-27 MED ORDER — SODIUM CHLORIDE 0.9 % IV SOLN: 250.0000 mL | INTRAVENOUS | Status: DC | PRN

## 2016-10-27 MED ORDER — FUROSEMIDE 10 MG/ML IJ SOLN
40.0000 mg | Freq: Two times a day (BID) | INTRAMUSCULAR | Status: DC
  Administered 2016-10-27 – 2016-10-28 (×3): 40 mg via INTRAVENOUS
  Filled 2016-10-27 (×3): qty 4

## 2016-10-27 MED ORDER — ENOXAPARIN SODIUM 40 MG/0.4ML ~~LOC~~ SOLN
40.0000 mg | SUBCUTANEOUS | Status: DC
  Administered 2016-10-27 (×2): 40 mg via SUBCUTANEOUS
  Filled 2016-10-27 (×2): qty 0.4

## 2016-10-27 MED ORDER — TECHNETIUM TC 99M DIETHYLENETRIAME-PENTAACETIC ACID
33.3000 | Freq: Once | INTRAVENOUS | Status: AC | PRN
  Administered 2016-10-27: 33.3 via INTRAVENOUS

## 2016-10-27 MED ORDER — IPRATROPIUM-ALBUTEROL 0.5-2.5 (3) MG/3ML IN SOLN: 3.0000 mL | RESPIRATORY_TRACT | Status: DC | PRN

## 2016-10-27 NOTE — Progress Notes (Signed)
Pts oxygen turned down to 1L d/t sats > 98%. Will continue to monitor pt

## 2016-10-27 NOTE — ED Notes (Signed)
Patient states "I have to pee and I can't pee with that thing on. I'm about to pee all over myself." Explained to patient how external catheter works, patient continues to states "I can't pee with that on." External catheter removed, patient placed on bedside commode.

## 2016-10-27 NOTE — Progress Notes (Signed)
LCSW consulted for transportation assistance due to patient lacking a DL  Information placed in AVS and CM has addressed medications. [facility] phone number and contact information reviewed.  AVS reflects how to reach and schedule rides.  No other needs warranted at this time. LCSW signed off, but available if additional needs arise.  Deretha EmoryHannah Bobbyjo Marulanda LCSW, MSW Clinical Social Work: Optician, dispensingystem [employer] Coverage for :  [phone]

## 2016-10-27 NOTE — Progress Notes (Signed)
  PROGRESS NOTE  [name] ZOX:[id number] DOB: 16-Aug-1955 DOA: 10/26/2016 PCP: Patient, No Pcp Per  Brief Narrative: 61 year old woman PMH MI, coronary stent May 2018, COPD, ongoing smoking, polysubstance abuse, presented with shortness of breath. Admitted for acute hypoxic respiratory failure, acute diastolic congestive heart failure.  Assessment/Plan Acute hypoxic and hypercapnic respiratory failure with acute respiratory acidosis. Multifactorial including acute CHF, underlying COPD, ongoing smoking, possibly benzodiazepine use. V/Q scan negative. Chest x-ray no acute disease. -Continue IV diuresis. Wean oxygen as tolerated. Clinically there is no evidence to suggest respiratory acidosis or hypercapnia. We'll follow clinically.  Acute diastolic congestive heart failure. [facility] LVEF 55-60 percent, grade 1-2 diastolic dysfunction, normal RV function. Discharge weight was 138 pounds. Patient 140 pounds on admission.  -Improving with diuresis -Reportedly was told to stop taking Lasix as an outpatient.   Recent ACS, MI, coronary stenting May 2018. No chest pain.  -Asymptomatic. Continue aspirin, Plavix, Coreg, Lipitor   Elevated d-dimer.  -V/Q scan negative.  Cigarette smoker -Will recommend cessation -Continue nicotine patch  PMH polysubstance abuse, hypertension, anxiety, hepatitis C   Overall improving. Continue IV diuresis. Still hypoxic. If continues to improve and weaned off oxygen, may reveal the go home 7/10  DVT prophylaxis: enoxaparin Code Status: full Family Communication: none Disposition Plan: home    Brendia Sacksaniel Goodrich, MD  [facility] Direct contact: [phone] --Via amion app OR  --[email or web address]; password [email or web address]  7PM-7AM contact night coverage as above 10/27/2016, 11:09 AM  LOS: 0 days   Consultants:    Procedures:    Antimicrobials:    Interval history/Subjective: Feels better, breathing better, no  pain.  Objective: Vitals: Afebrile, 98.3, 18, 71, 139/62, 99% on 3 L  Exam:     Constitutional: Appears calm, comfortable  Cardiovascular. Regular rate and rhythm. No murmur, rub or gallop. No lower extremity edema. Telemetry sinus rhythm.  Respiratory. Bilateral crackers. No rhonchi or rales. Normal respiratory effort.  Psychiatric. Grossly normal mood and affect. Speech fluent and appropriate.   I have personally reviewed the following:   Labs:  ABG on admission noted, 7.308/57/76  Basic metabolic panel unremarkable, magnesium within normal limits  BNP on admission 1259  Troponin negative  Imaging studies:  Chest x-ray no acute disease  V/Q scan negative  Medical tests:  EKG on admission independently reviewed sinus rhythm, no acute changes  Test discussed with performing physician:    Decision to obtain old records:    Review and summation of old records:    Scheduled Meds: . aspirin EC  81 mg Oral Daily  . atorvastatin  80 mg Oral q1800  . carvedilol  3.125 mg Oral BID WC  . clopidogrel  75 mg Oral Daily  . enoxaparin (LOVENOX) injection  40 mg Subcutaneous Q24H  . famotidine  20 mg Oral BID  . furosemide  40 mg Intravenous BID  . nicotine  21 mg Transdermal Daily  . sodium chloride flush  3 mL Intravenous Q12H  . sodium chloride flush  3 mL Intravenous Q12H   Continuous Infusions: . sodium chloride      Principal Problem:   Acute hypoxemic respiratory failure (HCC) Active Problems:   Substance abuse   Benzodiazepine dependence, continuous (HCC)   CAD, multiple vessel   Acute diastolic (congestive) heart failure (HCC)   LOS: 0 days

## 2016-10-27 NOTE — Care Management Note (Addendum)
Case Management Note  Patient Details  Name: Jean FloorDonna K Pinard MRN: [id number] Date of Birth: [birth year]  Subjective/Objective:  Adm from home with SOB, acute hypoxemic respiratory failure. Son lives patient. She is mostly independent with ADL's, does have an aide who helps out 2-3 days a week for 3-4 hours per day. Recent PCI in May of 2018. She is acutely on oxygen, hopes to wean prior to DC. She does still smoke. She reports not having a job, but does have BCBS with prescription coverage.             Action/Plan: Plans to return home with self care. CM following for needs.   ADDENDUM: CM spoke with patient's pharmacy, patient currently has $0 day co pay for most meds. Patient currently has Plavix and Lipitor filled and waiting  for pick up.   Expected Discharge Date:    10/29/2016            Expected Discharge Plan:  Home/Self Care  In-House Referral:     Discharge planning Services  CM Consult  Post Acute Care Choice:    Choice offered to:     DME Arranged:    DME Agency:     HH Arranged:    HH Agency:     Status of Service:  In process, will continue to follow  If discussed at Long Length of Stay Meetings, dates discussed:    Additional Comments:  Hadley Soileau, Chrystine OilerSharley Diane, RN 10/27/2016, 10:12 AM

## 2016-10-27 NOTE — Plan of Care (Signed)
Problem: Safety: Goal: Ability to remain free from injury will improve Outcome: Adequate for Discharge Pt educated on being a moderate fall risk d/t her receiving Lasix and BP medications. RN [facility]. Pt steady on feet, no hx of falls and pt A&Ox4. Bed in lowest locked position and call bell within reach. Will continue to monitor pt

## 2016-10-28 LAB — BASIC METABOLIC PANEL
Anion gap: 6 (ref 5–15)
BUN: 18 mg/dL (ref 6–20)
CO2: 38 mmol/L — ABNORMAL HIGH (ref 22–32)
Calcium: 9.1 mg/dL (ref 8.9–10.3)
Chloride: 96 mmol/L — ABNORMAL LOW (ref 101–111)
Creatinine, Ser: 0.67 mg/dL (ref 0.44–1.00)
GFR calc Af Amer: >60 mL/min (ref >60)
GFR calc non Af Amer: >60 mL/min (ref >60)
Glucose, Bld: 95 mg/dL (ref 65–99)
Potassium: 2.8 mmol/L — ABNORMAL LOW (ref 3.5–5.1)
Sodium: 140 mmol/L (ref 135–145)

## 2016-10-28 LAB — HIV ANTIBODY (ROUTINE TESTING W REFLEX): HIV Screen 4th Generation wRfx: NONREACTIVE

## 2016-10-28 LAB — POTASSIUM: Potassium: 3.2 mmol/L — ABNORMAL LOW (ref 3.5–5.1)

## 2016-10-28 MED ORDER — POTASSIUM CHLORIDE CRYS ER 20 MEQ PO TBCR
40.0000 meq | EXTENDED_RELEASE_TABLET | Freq: Once | ORAL | Status: AC
  Administered 2016-10-28: 40 meq via ORAL
  Filled 2016-10-28: qty 2

## 2016-10-28 MED ORDER — TRAZODONE HCL 100 MG PO TABS: 50.0000 mg | ORAL_TABLET | Freq: Every evening | ORAL | Status: AC | PRN

## 2016-10-28 MED ORDER — FUROSEMIDE 20 MG PO TABS: 20.0000 mg | ORAL_TABLET | Freq: Every day | ORAL | Status: DC

## 2016-10-28 MED ORDER — POTASSIUM CHLORIDE ER 20 MEQ PO TBCR: 10.0000 meq | EXTENDED_RELEASE_TABLET | Freq: Every day | ORAL | 0 refills | Status: AC

## 2016-10-28 NOTE — Progress Notes (Signed)
Pt's IV catheter removed and intact. Pt's IV site clean dry and intact. Discharge instructions including medications and follow up appointments were reviewed and discussed with patient. Pt verbalized understanding of discharge instructions.  All questions were answered and no further questions at this time. Pt in stable condition and in no acute distress at time of discharge. Pt escorted by nurse tech.  

## 2016-10-28 NOTE — Clinical Social Work Note (Signed)
Patient indicated that she will be going to live with a long time friend at discharge.  LCSW provided patient with a list of homeless shelters should she need one in the future.    Zakry Caso, Juleen ChinaHeather D, LCSW

## 2016-10-28 NOTE — Progress Notes (Signed)
Dr. Ophelia CharterYates made aware of pts Potassium level of 2.8. Dr. Ophelia CharterYates stated she is going to put in an order to repeat potassium level d/t pt not nauseated and vomiting.

## 2016-10-28 NOTE — Plan of Care (Signed)
Problem: Pain Managment: Goal: General experience of comfort will improve Outcome: Progressing Pt has no c/o pain this shift. Oxygen decreased to 1 L with pt tolerating well with O2 sats 97%. Will wean to RA. Pt resting quietly this shift. Will continue to monitor
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
## 2016-10-28 NOTE — Progress Notes (Signed)
Patient with K+ 2.8.  It was much higher yesterday and she has not been vomiting or had other apparent reason for loss.  Will repeat K+ in lab now and not intervene until this is back.  Georgana CurioJennifer E. Demontez Novack, M.D.

## 2016-10-28 NOTE — Progress Notes (Signed)
SATURATION QUALIFICATIONS: (This note is used to comply with regulatory documentation for home oxygen)  Patient Saturations on Room Air at Rest =97 %  Patient Saturations on Room Air while Ambulating = 95%    

## 2016-11-11 ENCOUNTER — Encounter: Payer: Self-pay | Admitting: Cardiology
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
## 2016-11-12 ENCOUNTER — Ambulatory Visit: Payer: Self-pay | Admitting: Cardiology
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
## 2016-12-03 ENCOUNTER — Ambulatory Visit (INDEPENDENT_AMBULATORY_CARE_PROVIDER_SITE_OTHER): Payer: BLUE CROSS/BLUE SHIELD | Admitting: Cardiology

## 2016-12-03 ENCOUNTER — Encounter: Payer: Self-pay | Admitting: Cardiology

## 2016-12-03 VITALS — BP 142/82 | HR 68 | Ht 65.5 in | Wt 132.0 lb

## 2016-12-03 DIAGNOSIS — K219 Gastro-esophageal reflux disease without esophagitis: Secondary | ICD-10-CM | POA: Diagnosis not present

## 2016-12-03 DIAGNOSIS — E782 Mixed hyperlipidemia: Secondary | ICD-10-CM | POA: Diagnosis not present

## 2016-12-03 DIAGNOSIS — Z720 Tobacco use: Secondary | ICD-10-CM | POA: Diagnosis not present

## 2016-12-03 DIAGNOSIS — I5032 Chronic diastolic (congestive) heart failure: Secondary | ICD-10-CM

## 2016-12-03 DIAGNOSIS — Z8639 Personal history of other endocrine, nutritional and metabolic disease: Secondary | ICD-10-CM

## 2016-12-03 DIAGNOSIS — I25119 Atherosclerotic heart disease of native coronary artery with unspecified angina pectoris: Secondary | ICD-10-CM | POA: Diagnosis not present

## 2016-12-03 MED ORDER — OMEPRAZOLE 40 MG PO CPDR: 40.0000 mg | DELAYED_RELEASE_CAPSULE | Freq: Every day | ORAL | 1 refills | Status: DC

## 2016-12-03 MED ORDER — FUROSEMIDE 20 MG PO TABS: 20.0000 mg | ORAL_TABLET | ORAL | 6 refills | Status: AC

## 2016-12-03 NOTE — Patient Instructions (Signed)
Medication Instructions:  Your physician recommends that you continue on your current medications as directed. Please refer to the Current Medication list given to you today.  Labwork: FASTING- LIVER,LIPIDS  Testing/Procedures: NONE  Follow-Up: Your physician wants you to follow-up in: 6 MONTHS WITH DR. MCDOWELL. You will receive a reminder letter in the mail two months in advance. If you don't receive a letter, please call our office to schedule the follow-up appointment.  Any Other Special Instructions Will Be Listed Below (If Applicable).  If you need a refill on your cardiac medications before your next appointment, please call your pharmacy.

## 2016-12-26 ENCOUNTER — Telehealth: Payer: Self-pay | Admitting: Cardiology

## 2016-12-26 NOTE — Telephone Encounter (Signed)
Patient wants a letter stating that she is unable to stay by herself and that she needs help with doing activities around the house.
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
## 2017-04-03 ENCOUNTER — Telehealth: Payer: Self-pay | Admitting: Cardiology

## 2017-04-03 NOTE — Telephone Encounter (Signed)
Received communication from [employer] D[employer] in [place] regarding anticipated dental procedure.  Patient to undergo dental extractions and alveoloplasty.  Generic form sent regarding preprocedure assessment of cardiac history and medications.  I have seen [name] on only one occasion back in August 2018, was sent for consultation at that time after a hospital stay .  Her cardiac history includes CAD with DES to the circumflex in May [facility].  Additional problems which we are not specifically following include COPD, hepatitis C, hyperlipidemia, hypertension, hypothyroidism, polysubstance abuse, and GERD.  Current cardiac regimen includes aspirin, Coreg, Plavix, Lipitor, Lasix, and potassium supplements as well as sublingual nitroglycerin.  Allergies that we have listed include Demerol, gabapentin, intravenous contrast dye, morphine, and Toradol.  As far as choice of anesthetic medications, pain medications, and antibiotics, I would defer this to the provider performing the procedure based on review of patient allergies and intolerances.  Her present cardiac history would not substantively impact this choice.  She does not require SBE prophylaxis based on her current cardiac history as of evaluation in August.  She is not on Coumadin or other anticoagulant medications.  Since she did undergo a DES intervention in May of this year, would not recommend holding aspirin or Plavix.  Jonelle SidleSamuel G. Rosine Solecki, M.D., F.A.C.C.

## 2017-04-03 NOTE — Telephone Encounter (Signed)
Form given to Dr. Diona BrownerMcDowell.

## 2017-04-03 NOTE — Telephone Encounter (Signed)
Patient called stating that Oral surgeons office told her that they have confirmed receipt that a fax for surgical clearance was sent to us on 04/01/17.   She would like to know if we have this

## 2017-04-07 NOTE — Progress Notes (Deleted)
Natalie Reid is a 61 y.o. female    Reason for Visit: Establish care  CC: "I  "    HPI Natalie BuryDonna Reid is here for initial consultation at the request of K. Bryn Gullingeuter NP to establish care. She has a past medical hx of CAD, s/p MI and PCI, HLD, CHF, COPD, HTN, hepatitis C and tobacco use. Today she denies chest pain, palpitations, dizziness, syncope, leg swelling and increased dyspnea.      Review of Systems   Constitutional: Negative for chills, diaphoresis and fever.   HENT: Negative for congestion, nosebleeds and tinnitus.    Eyes: Negative for redness.   Respiratory: Negative for cough, shortness of breath and wheezing.    Cardiovascular: Negative for chest pain, palpitations and leg swelling.   Gastrointestinal: Negative for blood in stool and nausea.   Genitourinary: Negative for dysuria and hematuria.   Musculoskeletal: Negative for myalgias and neck pain.   Neurological: Negative for dizziness.   Hematological: Does not bruise/bleed easily.       Physical Exam   Constitutional: She is oriented to person, place, and time. She appears well-developed and well-nourished.   HENT:   Head: Normocephalic and atraumatic.   Eyes: Conjunctivae and EOM are normal.   Neck: Normal range of motion. Neck supple.   Cardiovascular: Normal rate, regular rhythm, S1 normal and S2 normal.  Exam reveals no gallop.    No murmur heard.  Pulmonary/Chest: Effort normal and breath sounds normal.   Abdominal: Soft. Bowel sounds are normal.   Musculoskeletal: Normal range of motion.   Neurological: She is alert and oriented to person, place, and time.   Skin: Skin is warm and dry.   Psychiatric: She has a normal mood and affect. Her behavior is normal.   Nursing note and vitals reviewed.      Wt Readings from Last 3 Encounters:   No data found for Wt     BP Readings from Last 3 Encounters:   No data found for BP     Pulse Readings from Last 3 Encounters:   No data found for Pulse       No current outpatient prescriptions on file.    Past  Medical History:   Diagnosis Date   . CAD (coronary artery disease)     S/P MI and PCI   . CHF (congestive heart failure) ([facility])     Hx.   Marland Kitchen. COPD (chronic obstructive pulmonary disease) ([facility])     managed by PCP.   Marland Kitchen. Hepatitis C     Hx   . HLD (hyperlipidemia)     taking statin.    Marland Kitchen. HTN (hypertension)    . Tobacco use disorder     Cessation discussed       Social History     Social History   . Marital status: N/A     Spouse name: N/A   . Number of children: N/A   . Years of education: N/A     Social History Main Topics   . Smoking status: Not on file   . Smokeless tobacco: Not on file   . Alcohol use Not on file   . Drug use: Unknown   . Sexual activity: Not on file     Other Topics Concern   . Not on file     Social History Narrative   . No narrative on file       No past surgical history on file.    No family history  on file.    Allergies not on file    Recent Labs and Imaging reviewed    Assessment    . CAD (coronary artery disease)     S/P MI and PCI   . CHF (congestive heart failure) ([facility])     Hx.   Marland Kitchen. COPD (chronic obstructive pulmonary disease) ([facility])     managed by PCP.   Marland Kitchen. Hepatitis C     Hx   . HLD (hyperlipidemia)     taking statin.    Marland Kitchen. HTN (hypertension)- controlled.     . Tobacco use disorder     Cessation discussed       Plan  Clinically stable. No evidence of CHF. No angina. Continue ASA, statin and BB. Risk factor modification also discussed.         No Follow-up on file.    This note was scribed in the presence of the physician by Ludwig LeanSheila Hollyn Stucky RN.

## 2017-04-22 ENCOUNTER — Encounter: Attending: Cardiovascular Disease | Primary: Nurse Practitioner

## 2017-05-01 NOTE — Progress Notes (Deleted)
Natalie Reid is a 62 y.o. female    Reason for Visit: Establish care  CC: "I  "    HPI Natalie BuryDonna Reid is here for initial consultation at the request of K. Bryn Gullingeuter NP to establish care. She has a past medical hx of CAD, s/p MI and PCI, HLD, CHF, COPD, HTN, hepatitis C and tobacco use. Today she denies chest pain, palpitations, dizziness, syncope, leg swelling and increased dyspnea.      Review of Systems   Constitutional: Negative for chills, diaphoresis and fever.   HENT: Negative for congestion, nosebleeds and tinnitus.    Eyes: Negative for redness.   Respiratory: Negative for cough, shortness of breath and wheezing.    Cardiovascular: Negative for chest pain, palpitations and leg swelling.   Gastrointestinal: Negative for blood in stool and nausea.   Genitourinary: Negative for dysuria and hematuria.   Musculoskeletal: Negative for myalgias and neck pain.   Neurological: Negative for dizziness.   Hematological: Does not bruise/bleed easily.       Physical Exam   Constitutional: She is oriented to person, place, and time. She appears well-developed and well-nourished.   HENT:   Head: Normocephalic and atraumatic.   Eyes: Conjunctivae and EOM are normal.   Neck: Normal range of motion. Neck supple.   Cardiovascular: Normal rate, regular rhythm, S1 normal and S2 normal.  Exam reveals no gallop.    No murmur heard.  Pulmonary/Chest: Effort normal and breath sounds normal.   Abdominal: Soft. Bowel sounds are normal.   Musculoskeletal: Normal range of motion.   Neurological: She is alert and oriented to person, place, and time.   Skin: Skin is warm and dry.   Psychiatric: She has a normal mood and affect. Her behavior is normal.   Nursing note and vitals reviewed.      Wt Readings from Last 3 Encounters:   No data found for Wt     BP Readings from Last 3 Encounters:   No data found for BP     Pulse Readings from Last 3 Encounters:   No data found for Pulse       No current outpatient prescriptions on file.    Past  Medical History:   Diagnosis Date   . CAD (coronary artery disease)     S/P MI and PCI   . CHF (congestive heart failure) (HCC)     Hx.   Marland Kitchen. COPD (chronic obstructive pulmonary disease) ([facility])     managed by PCP.   Marland Kitchen. Hepatitis C     Hx   . HLD (hyperlipidemia)     taking statin.    Marland Kitchen. HTN (hypertension)    . Tobacco use disorder     Cessation discussed       Social History     Social History   . Marital status: N/A     Spouse name: N/A   . Number of children: N/A   . Years of education: N/A     Social History Main Topics   . Smoking status: Not on file   . Smokeless tobacco: Not on file   . Alcohol use Not on file   . Drug use: Unknown   . Sexual activity: Not on file     Other Topics Concern   . Not on file     Social History Narrative   . No narrative on file       No past surgical history on file.    No family history  on file.    Allergies not on file    Recent Labs and Imaging reviewed    Assessment    . CAD (coronary artery disease)     S/P MI and PCI   . CHF (congestive heart failure) (HCC)     Hx.   Marland Kitchen COPD (chronic obstructive pulmonary disease) (H[facility])     managed by PCP.   Marland Kitchen Hepatitis C     Hx   . HLD (hyperlipidemia)     taking statin.    Marland Kitchen HTN (hypertension)- controlled.     . Tobacco use disorder     Cessation discussed       Plan  Clinically stable. No evidence of CHF. No angina. Continue ASA, statin and BB. Risk factor modification also discussed.   Fasting lipid profile, CMP prior to next visit.       No Follow-up on file.    This note was scribed in the presence of the physician by Ludwig Lean RN.

## 2017-05-11 ENCOUNTER — Encounter: Attending: Cardiovascular Disease | Primary: Nurse Practitioner

## 2017-05-28 ENCOUNTER — Other Ambulatory Visit: Payer: Self-pay | Admitting: Cardiology

## 2017-07-02 ENCOUNTER — Ambulatory Visit: Admit: 2017-07-03 | Payer: PRIVATE HEALTH INSURANCE | Attending: Cardiovascular Disease | Primary: Nurse Practitioner

## 2017-07-02 DIAGNOSIS — I10 Essential (primary) hypertension: Secondary | ICD-10-CM

## 2017-07-02 MED ORDER — FUROSEMIDE 20 MG PO TABS
20 | ORAL_TABLET | ORAL | 5 refills | Status: DC | PRN
Start: 2017-07-02 — End: 2017-12-14

## 2017-07-02 MED ORDER — CARVEDILOL 3.125 MG PO TABS
3.125 MG | ORAL_TABLET | Freq: Two times a day (BID) | ORAL | 5 refills | Status: DC
Start: 2017-07-02 — End: 2018-01-13

## 2017-07-02 MED ORDER — ATORVASTATIN CALCIUM 80 MG PO TABS
80 MG | ORAL_TABLET | Freq: Every day | ORAL | 5 refills | Status: DC
Start: 2017-07-02 — End: 2017-12-14

## 2017-07-02 MED ORDER — CLOPIDOGREL BISULFATE 75 MG PO TABS
75 MG | ORAL_TABLET | Freq: Every day | ORAL | 5 refills | Status: DC
Start: 2017-07-02 — End: 2017-12-14

## 2017-07-02 MED ORDER — ASPIRIN 81 MG PO TABS
81 MG | ORAL_TABLET | Freq: Every day | ORAL | 5 refills | Status: DC
Start: 2017-07-02 — End: 2017-12-14

## 2017-07-02 NOTE — Patient Instructions (Signed)
Patient Education        Stopping Smoking: Care Instructions  Your Care Instructions  Cigarette smokers crave the nicotine in cigarettes. Giving it up is much harder than simply changing a habit. Your body has to stop craving the nicotine. It is hard to quit, but you can do it. There are many tools that people use to quit smoking. You may find that combining tools works best for you.  There are several steps to quitting. First you get ready to quit. Then you get support to help you. After that, you learn new skills and behaviors to become a nonsmoker. For many people, a necessary step is getting and using medicine.  Your doctor will help you set up the plan that best meets your needs. You may want to attend a smoking cessation program to help you quit smoking. When you choose a program, look for one that has proven success. Ask your doctor for ideas. You will greatly increase your chances of success if you take medicine as well as get counseling or join a cessation program.  Some of the changes you feel when you first quit tobacco are uncomfortable. Your body will miss the nicotine at first, and you may feel short-tempered and grumpy. You may have trouble sleeping or concentrating. Medicine can help you deal with these symptoms. You may struggle with changing your smoking habits and rituals. The last step is the tricky one: Be prepared for the smoking urge to continue for a time. This is a lot to deal with, but keep at it. You will feel better.  Follow-up care is a key part of your treatment and safety. Be sure to make and go to all appointments, and call your doctor if you are having problems. It's also a good idea to know your test results and keep a list of the medicines you take.  How can you care for yourself at home?  ?? Ask your family, friends, and coworkers for support. You have a better chance of quitting if you have help and support.  ?? Join a support group, such as Nicotine Anonymous, for people who are  trying to quit smoking.  ?? Consider signing up for a smoking cessation program, such as the American Lung Association's Freedom from Smoking program.  ?? Get text messaging support. Go to the website at www.smokefree.gov to sign up for the SmokefreeTXT program.  ?? Set a quit date. Pick your date carefully so that it is not right in the middle of a big deadline or stressful time. Once you quit, do not even take a puff. Get rid of all ashtrays and lighters after your last cigarette. Clean your house and your clothes so that they do not smell of smoke.  ?? Learn how to be a nonsmoker. Think about ways you can avoid those things that make you reach for a cigarette.  ? Avoid situations that put you at greatest risk for smoking. For some people, it is hard to have a drink with friends without smoking. For others, they might skip a coffee break with coworkers who smoke.  ? Change your daily routine. Take a different route to work or eat a meal in a different place.  ?? Cut down on stress. Calm yourself or release tension by doing an activity you enjoy, such as reading a book, taking a hot bath, or gardening.  ?? Talk to your doctor or pharmacist about nicotine replacement therapy, which replaces the nicotine in your body. You   still get nicotine but you do not use tobacco. Nicotine replacement products help you slowly reduce the amount of nicotine you need. These products come in several forms, many of them available over-the-counter:  ? Nicotine patches  ? Nicotine gum and lozenges  ? Nicotine inhaler  ?? Ask your doctor about bupropion (Wellbutrin) or varenicline (Chantix), which are prescription medicines. They do not contain nicotine. They help you by reducing withdrawal symptoms, such as stress and anxiety.  ?? Some people find hypnosis, acupuncture, and massage helpful for ending the smoking habit.  ?? Eat a healthy diet and get regular exercise. Having healthy habits will help your body move past its craving for  nicotine.  ?? Be prepared to keep trying. Most people are not successful the first few times they try to quit. Do not get mad at yourself if you smoke again. Make a list of things you learned and think about when you want to try again, such as next week, next month, or next year.  Where can you learn more?  Go to [email or web address] and sign in to your MyChart account. Enter Y522 in the Search Health Information box to learn more about "Stopping Smoking: Care Instructions."     If you do not have an account, please click on the "Sign Up Now" link.  Current as of: January 14, 2017  Content Version: 11.9  ?? 2006-2018 [employer]. Care instructions adapted under license by [facility]. If you have questions about a medical condition or this instruction, always ask your healthcare professional. [employer], Inc[employer] disclaims any warranty or liability for your use of this information.

## 2017-07-02 NOTE — Progress Notes (Signed)
Natalie Reid is a 62 y.o. female    Reason for Visit:establish care    HPI Natalie Reid is here for initial consultation at the request of K. Bryn Gulling NP to establish care. She recently moved to this area from [facility]. She has a past medical hx of CAD, s/p MI and PCI 08/2016 in [place], CHF, HTN, HLD, COPD, tobacco use and hepatitis C. She rarely drinks Etoh. She has a family hx of heart disease. Today she denies chest pain, palpitations, dizziness, syncope, leg swelling, PND and worsening DOE. She does have headaches. She had severe heartburn when she had her heart attack. She has not taken her prn lasix recently as she is doing well.     Review of Systems   Constitutional: Negative for chills, diaphoresis and fever.   HENT: Negative for congestion, nosebleeds and tinnitus.    Eyes: Negative for redness.   Respiratory: Negative for cough, shortness of breath and wheezing.    Cardiovascular: Negative for chest pain, palpitations and leg swelling.   Gastrointestinal: Negative for blood in stool and nausea.   Genitourinary: Negative for dysuria and hematuria.   Musculoskeletal: Negative for myalgias and neck pain.   Neurological: Negative for dizziness.   Hematological: Does not bruise/bleed easily.        Physical Exam   Constitutional: She is oriented to person, place, and time. She appears well-developed and well-nourished.   HENT:   Head: Normocephalic and atraumatic.   Eyes: Conjunctivae and EOM are normal.   Neck: Normal range of motion. Neck supple.   Cardiovascular: Normal rate, regular rhythm, S1 normal and S2 normal. Exam reveals no gallop.   No murmur heard.  Pulmonary/Chest: Effort normal and breath sounds normal.   Abdominal: Soft. She exhibits no distension.   Musculoskeletal: Normal range of motion.   Neurological: She is alert and oriented to person, place, and time.   Skin: Skin is warm and dry.   Psychiatric: She has a normal mood and affect. Her behavior is normal.       Wt Readings from Last 3 Encounters:    07/02/17 130 lb (59 kg)     BP Readings from Last 3 Encounters:   07/02/17 122/80     Pulse Readings from Last 3 Encounters:   07/02/17 66         Current Outpatient Medications:   .  carvedilol (COREG) 3.125 MG tablet, Take 3.125 mg by mouth 2 times daily (with meals), Disp: , Rfl:   .  traZODone (DESYREL) 150 MG tablet, Take 150 mg by mouth nightly, Disp: , Rfl:   .  aspirin 81 MG tablet, Take 81 mg by mouth daily, Disp: , Rfl:   .  clopidogrel (PLAVIX) 75 MG tablet, Take 75 mg by mouth daily, Disp: , Rfl:   .  topiramate (TOPIRAGEN) 50 MG tablet, Take 50 mg by mouth 2 times daily, Disp: , Rfl:   .  atorvastatin (LIPITOR) 80 MG tablet, Take 80 mg by mouth daily, Disp: , Rfl:   .  omeprazole (PRILOSEC) 40 MG delayed release capsule, Take 40 mg by mouth daily, Disp: , Rfl:   .  furosemide (LASIX) 20 MG tablet, Take 20 mg by mouth as needed, Disp: , Rfl:     Past Medical History:   Diagnosis Date   . CAD (coronary artery disease)     S/P MI and PCI   . CHF (congestive heart failure) (HCC)     Hx.   Marland Kitchen COPD (chronic  obstructive pulmonary disease) (HCC)     managed by PCP.   Marland Kitchen Hepatitis C     Hx   . HLD (hyperlipidemia)     taking statin.    Marland Kitchen HTN (hypertension)    . Tobacco use disorder     Cessation discussed       Social History     Socioeconomic History   . Marital status: None     Spouse name: None   . Number of children: None   . Years of education: None   . Highest education level: None   Occupational History   . None   Social Needs   . Financial resource strain: None   . Food insecurity:     Worry: None     Inability: None   . Transportation needs:     Medical: None     Non-medical: None   Tobacco Use   . Smoking status: Current Every Day Smoker     Packs/day: 0.50     Types: Cigarettes   . Smokeless tobacco: Never Used   Substance and Sexual Activity   . Alcohol use: Never     Frequency: Never   . Drug use: Never   . Sexual activity: None   Lifestyle   . Physical activity:     Days per week: None     Minutes  per session: None   . Stress: None   Relationships   . Social connections:     Talks on phone: None     Gets together: None     Attends religious service: None     Active member of club or organization: None     Attends meetings of clubs or organizations: None     Relationship status: None   . Intimate partner violence:     Fear of current or ex partner: None     Emotionally abused: None     Physically abused: None     Forced sexual activity: None   Other Topics Concern   . None   Social History Narrative   . None       Past Surgical History:   Procedure Laterality Date   . CESAREAN SECTION     . CORONARY ANGIOPLASTY WITH STENT PLACEMENT     . TONSILLECTOMY     . TUBAL LIGATION     . UPPER GASTROINTESTINAL ENDOSCOPY     . URETER STENT PLACEMENT         Family History   Problem Relation Age of Onset   . Heart Failure Mother        Allergies   Allergen Reactions   . Demerol Hcl [Meperidine]    . Dye [Iodides]      ivp dye       Recent Labs and Imaging reviewed    Assessment    . CAD (coronary artery disease)     S/P MI and PCI 08/2016. EKG 06/2017 NS[facility], [facility]. Pt's angina is severe heartburn.    . CHF (congestive heart failure) (HCC)     Hx.   Marland Kitchen COPD (chronic obstructive pulmonary disease) (HCC)     managed by PCP.   Marland Kitchen Hepatitis C     Hx   . HLD (hyperlipidemia)     taking statin.    Marland Kitchen HTN (hypertension)- Controlled    . Tobacco use disorder     Cessation discussed       Plan  Will request records from [facility]  in [place]. No angina or clinical evidence of CHF. Pt declined cardiac rehab due to cost. Continue ASA, statin, BB, plavix and prn lasix. Fasting lipid profile, proBNP, CMP now. Recommend echocardiogram to evaluate LVEF.     Return in about 6 months (around 01/02/2018).    This note was scribed in the presence of the physician by Ludwig Lean RN.    The scribes documentation has been prepared under my direction and personally reviewed by me in its entirety.    I confirm that the note above accurately  reflects all work, treatment, procedures, and medical decision making performed by me.

## 2017-11-30 NOTE — Telephone Encounter (Signed)
Called the Pt regarding scheduling an appt with Dr.Gill in 2wk. Pt states according to hospital evaluations she doesn't require to follow with Nephrology at this time. Please advise.

## 2017-12-14 MED ORDER — ASPIRIN 81 81 MG PO TBEC
81 MG | ORAL_TABLET | ORAL | 5 refills | Status: DC
Start: 2017-12-14 — End: 2018-06-17

## 2017-12-14 MED ORDER — FUROSEMIDE 20 MG PO TABS
20 MG | ORAL_TABLET | ORAL | 5 refills | Status: DC
Start: 2017-12-14 — End: 2018-06-17

## 2017-12-14 MED ORDER — ATORVASTATIN CALCIUM 80 MG PO TABS
80 MG | ORAL_TABLET | Freq: Every day | ORAL | 5 refills | Status: DC
Start: 2017-12-14 — End: 2018-06-17

## 2017-12-14 MED ORDER — CLOPIDOGREL BISULFATE 75 MG PO TABS
75 MG | ORAL_TABLET | Freq: Every day | ORAL | 5 refills | Status: DC
Start: 2017-12-14 — End: 2018-06-17

## 2017-12-14 NOTE — Telephone Encounter (Signed)
[facility] Per discharge summary patient was started on amlodipine 5 mg. The pharmacy needs to confirm which does she needs to be taking because they got a prescription for 10 mg and 5mg . Called Katie from [facility] back when reviewing discharge summary and advised her to call the nursing home because it could have changed since being at [facility]. States she will send the patient the 10 mg.

## 2017-12-14 NOTE — Telephone Encounter (Signed)
Please sign prescriptions.

## 2017-12-25 ENCOUNTER — Encounter: Attending: Cardiovascular Disease | Primary: Nurse Practitioner

## 2017-12-25 NOTE — Progress Notes (Deleted)
Natalie Reid is a 62 y.o. female    Reason for Visit: CHF, swelling    HPI Natalie Reid is here at the request of her PCP for evaluation of swelling and CHF.     Review of Systems   Constitutional: Negative for chills, diaphoresis and fever.   HENT: Negative for congestion, nosebleeds and tinnitus.    Eyes: Negative for redness.   Respiratory: Negative for cough, shortness of breath and wheezing.    Cardiovascular: Positive for leg swelling. Negative for chest pain and palpitations.   Gastrointestinal: Negative for blood in stool and nausea.   Genitourinary: Negative for dysuria and hematuria.   Musculoskeletal: Negative for myalgias and neck pain.   Neurological: Negative for dizziness.   Hematological: Does not bruise/bleed easily.        Physical Exam   Constitutional: She is oriented to person, place, and time. She appears well-developed and well-nourished.   HENT:   Head: Normocephalic and atraumatic.   Eyes: Conjunctivae and EOM are normal.   Neck: Normal range of motion. Neck supple.   Cardiovascular: Normal rate, regular rhythm, S1 normal and S2 normal. Exam reveals no gallop.   No murmur heard.  Pulmonary/Chest: Effort normal and breath sounds normal.   Abdominal: Soft. She exhibits no distension.   Musculoskeletal: Normal range of motion.   Neurological: She is alert and oriented to person, place, and time.   Skin: Skin is warm and dry.   Psychiatric: She has a normal mood and affect. Her behavior is normal.       Wt Readings from Last 3 Encounters:   07/02/17 130 lb (59 kg)     BP Readings from Last 3 Encounters:   07/02/17 122/80     Pulse Readings from Last 3 Encounters:   07/02/17 66         Current Outpatient Medications:   ???  atorvastatin (LIPITOR) 80 MG tablet, TAKE 1 TABLET BY MOUTH DAILY, Disp: 30 tablet, Rfl: 5  ???  furosemide (LASIX) 20 MG tablet, TAKE 1 TABLET BY MOUTH AS NEEDED, Disp: 30 tablet, Rfl: 5  ???  clopidogrel (PLAVIX) 75 MG tablet, TAKE 1 TABLET BY MOUTH DAILY, Disp: 30 tablet, Rfl:  5  ???  ASPIRIN 81 81 MG EC tablet, TAKE 1 TABLET BY MOUTH DAILY, Disp: 30 tablet, Rfl: 5  ???  traZODone (DESYREL) 150 MG tablet, Take 150 mg by mouth nightly, Disp: , Rfl:   ???  topiramate (TOPIRAGEN) 50 MG tablet, Take 50 mg by mouth 2 times daily, Disp: , Rfl:   ???  omeprazole (PRILOSEC) 40 MG delayed release capsule, Take 40 mg by mouth daily, Disp: , Rfl:   ???  carvedilol (COREG) 3.125 MG tablet, Take 1 tablet by mouth 2 times daily (with meals), Disp: 60 tablet, Rfl: 5    Past Medical History:   Diagnosis Date   ??? CAD (coronary artery disease)     S/P MI and PCI   ??? CHF (congestive heart failure) (HCC)     Hx.   ??? COPD (chronic obstructive pulmonary disease) (HCC)     managed by PCP.   ??? Hepatitis C     Hx   ??? HLD (hyperlipidemia)     taking statin.    ??? HTN (hypertension)    ??? Tobacco use disorder     Cessation discussed       Social History     Socioeconomic History   ??? Marital status: Divorced     Spouse  name: Not on file   ??? Number of children: Not on file   ??? Years of education: Not on file   ??? Highest education level: Not on file   Occupational History   ??? Not on file   Social Needs   ??? Financial resource strain: Not on file   ??? Food insecurity:     Worry: Not on file     Inability: Not on file   ??? Transportation needs:     Medical: Not on file     Non-medical: Not on file   Tobacco Use   ??? Smoking status: Current Every Day Smoker     Packs/day: 0.50     Types: Cigarettes   ??? Smokeless tobacco: Never Used   Substance and Sexual Activity   ??? Alcohol use: Never     Frequency: Never   ??? Drug use: Never   ??? Sexual activity: Not on file   Lifestyle   ??? Physical activity:     Days per week: Not on file     Minutes per session: Not on file   ??? Stress: Not on file   Relationships   ??? Social connections:     Talks on phone: Not on file     Gets together: Not on file     Attends religious service: Not on file     Active member of club or organization: Not on file     Attends meetings of clubs or organizations: Not on  file     Relationship status: Not on file   ??? Intimate partner violence:     Fear of current or ex partner: Not on file     Emotionally abused: Not on file     Physically abused: Not on file     Forced sexual activity: Not on file   Other Topics Concern   ??? Not on file   Social History Narrative   ??? Not on file       Past Surgical History:   Procedure Laterality Date   ??? CESAREAN SECTION     ??? CORONARY ANGIOPLASTY WITH STENT PLACEMENT     ??? TONSILLECTOMY     ??? TUBAL LIGATION     ??? UPPER GASTROINTESTINAL ENDOSCOPY     ??? URETER STENT PLACEMENT         Family History   Problem Relation Age of Onset   ??? Heart Failure Mother        Allergies   Allergen Reactions   ??? Demerol Hcl [Meperidine]    ??? Dye [Iodides]      ivp dye       Recent Labs and Imaging reviewed    Assessment    ??? CAD (coronary artery disease)     S/P MI and PCI 08/2016. EKG 06/2017 NSR, [facility]. Pt's angina is severe heartburn.    ??? CHF (congestive heart failure) (HCC)     Hx.   ??? COPD (chronic obstructive pulmonary disease) (HCC)     managed by PCP.   ??? Hepatitis C     Hx   ??? HLD (hyperlipidemia)     taking statin.    ??? HTN (hypertension)- Controlled    ??? Tobacco use disorder     Cessation discussed       Plan   No angina or clinical evidence of CHF. Pt declined cardiac rehab due to cost. Continue ASA, statin, BB, plavix and prn lasix. Fasting lipid profile, proBNP, CMP now. Recommend echocardiogram  to evaluate LVEF.     No follow-ups on file.    This note was scribed in the presence of the physician by Ludwig Lean RN.

## 2018-01-06 ENCOUNTER — Encounter: Attending: Cardiovascular Disease | Primary: Nurse Practitioner

## 2018-01-11 ENCOUNTER — Encounter: Attending: Cardiovascular Disease | Primary: Nurse Practitioner

## 2018-01-13 MED ORDER — CARVEDILOL 3.125 MG PO TABS
3.125 MG | ORAL_TABLET | Freq: Two times a day (BID) | ORAL | 0 refills | Status: DC
Start: 2018-01-13 — End: 2018-02-13

## 2018-01-13 NOTE — Telephone Encounter (Signed)
Last ov 07/02/17  Pending appt overdue for appt  Last refill 07/02/17 #[id number]    My chart message sent to pt of needing an appt

## 2018-02-15 MED ORDER — CARVEDILOL 3.125 MG PO TABS
3.125 MG | ORAL_TABLET | Freq: Two times a day (BID) | ORAL | 5 refills | Status: DC
Start: 2018-02-15 — End: 2018-07-16

## 2018-02-15 NOTE — Telephone Encounter (Signed)
Last OV:07/02/17  Next ZO:XWRU  Labs:12/22/17 Lab Result

## 2018-02-25 NOTE — Telephone Encounter (Signed)
We rec'd a prescrition fax from Dr. Serena Colonel office with his name crossed out and Dr. Humphrey Rolls name handwritten on it.  Reviewed chart. Patient last seen in March and requested to return in 6 months.  Called patient to schedule OV with Dr. Earvin Hansen.  Patient said that she no longer is seen by [place] due to insurance change.  She will notify her current physician to take care of her refill.

## 2018-04-30 IMAGING — NM NM PULMONARY VENT & PERF
16 series · 16 of 16 positions shown · non-contrast
Comparison: Chest x-ray 10/26/2016

CLINICAL DATA: The shortness of breath since yesterday.

EXAM:
NUCLEAR MEDICINE VENTILATION - PERFUSION LUNG SCAN
TECHNIQUE: Ventilation images were obtained in multiple projections using
inhaled aerosol Vc-11m DTPA. Perfusion images were obtained in
multiple projections after intravenous injection of Vc-11m MAA.
RADIOPHARMACEUTICALS:  33.3 mCi Xechnetium-YYm DTPA aerosol
inhalation and 4.6 mCi Xechnetium-YYm MAA IV

[Series 1: ant/post vent · 4.14mm/px · 1 of 1 slices shown (1 of 2)]
[im 1/1]
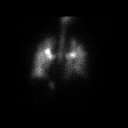

[Series 1: ant/post vent · 4.14mm/px · 1 of 1 slices shown (2 of 2)]
[im 1/1]
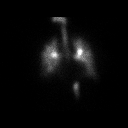

[Series 2: lao/rpo vent · 4.14mm/px · 1 of 1 slices shown (1 of 2)]
[im 1/1]
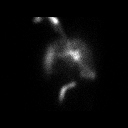

[Series 2: lao/rpo vent · 4.14mm/px · 1 of 1 slices shown (2 of 2)]
[im 1/1]
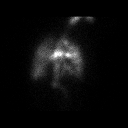

[Series 3: lt lat/rt lat vent · 4.14mm/px · 1 of 1 slices shown (1 of 2)]
[im 1/1]
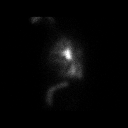

[Series 3: lt lat/rt lat vent · 4.14mm/px · 1 of 1 slices shown (2 of 2)]
[im 1/1]
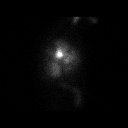

[Series 4: lpo/rao vent · 4.14mm/px · 1 of 1 slices shown (1 of 2)]
[im 1/1]
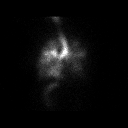

[Series 4: lpo/rao vent · 4.14mm/px · 1 of 1 slices shown (2 of 2)]
[im 1/1]
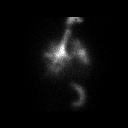

[Series 5: ant/post perf · 4.14mm/px · 1 of 1 slices shown (1 of 2)]
[im 1/1]
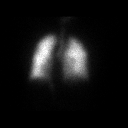

[Series 5: ant/post perf · 4.14mm/px · 1 of 1 slices shown (2 of 2)]
[im 1/1]
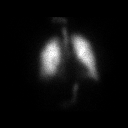

[Series 6: lao/rpo perf · 4.14mm/px · 1 of 1 slices shown (1 of 2)]
[im 1/1]
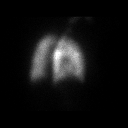

[Series 6: lao/rpo perf · 4.14mm/px · 1 of 1 slices shown (2 of 2)]
[im 1/1]
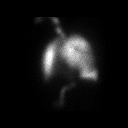

[Series 7: lt lat/rt lat perf · 4.14mm/px · 1 of 1 slices shown (1 of 2)]
[im 1/1]
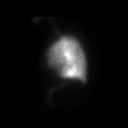

[Series 7: lt lat/rt lat perf · 4.14mm/px · 1 of 1 slices shown (2 of 2)]
[im 1/1]
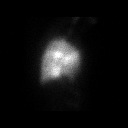

[Series 8: lpo/rao perf · 4.14mm/px · 1 of 1 slices shown (1 of 2)]
[im 1/1]
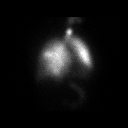

[Series 8: lpo/rao perf · 4.14mm/px · 1 of 1 slices shown (2 of 2)]
[im 1/1]
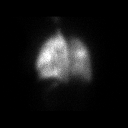

[16 of 16 positions shown; findings below may reference images not displayed]

FINDINGS: Ventilation: No focal ventilation defect. Moderate central clumping
of the radiopharmaceutical and slightly heterogeneous uptake in the
lungs.

Perfusion: No wedge shaped peripheral perfusion defects to suggest
acute pulmonary embolism.
IMPRESSION: Negative V/Q scan for pulmonary embolism.

## 2018-06-17 MED ORDER — CLOPIDOGREL BISULFATE 75 MG PO TABS
75 MG | ORAL_TABLET | ORAL | 0 refills | Status: DC
Start: 2018-06-17 — End: 2018-08-18

## 2018-06-17 MED ORDER — FUROSEMIDE 20 MG PO TABS
20 MG | ORAL_TABLET | ORAL | 0 refills | Status: DC
Start: 2018-06-17 — End: 2018-08-18

## 2018-06-17 MED ORDER — ATORVASTATIN CALCIUM 80 MG PO TABS
80 MG | ORAL_TABLET | Freq: Every day | ORAL | 0 refills | Status: DC
Start: 2018-06-17 — End: 2018-08-18

## 2018-06-17 MED ORDER — ASPIRIN LOW DOSE 81 MG PO TBEC
81 MG | ORAL_TABLET | ORAL | 0 refills | Status: DC
Start: 2018-06-17 — End: 2018-08-18

## 2018-06-17 NOTE — Telephone Encounter (Signed)
Last ov 07/02/17  Pending appt overdue for apt  Last refill all meds 12/14/17 1 month x5  Last labs none for lipitor    My chart message sent to pt of needing an apt

## 2018-07-16 MED ORDER — CARVEDILOL 3.125 MG PO TABS
3.125 MG | ORAL_TABLET | Freq: Two times a day (BID) | ORAL | 0 refills | Status: DC
Start: 2018-07-16 — End: 2019-03-30

## 2018-07-16 NOTE — Telephone Encounter (Signed)
Last OV: 07/02/17  Next OV: x  Labs:   EKG:    Pt need app for more refills

## 2018-08-18 NOTE — Telephone Encounter (Signed)
Last ov 07/02/17  Pending appt overdue  Last refill all meds 06/17/18 1 month NR  Last labs none in the chart

## 2018-08-19 MED ORDER — ASPIRIN ADULT LOW STRENGTH 81 MG PO TBEC
81 MG | ORAL_TABLET | ORAL | 0 refills | Status: DC
Start: 2018-08-19 — End: 2018-09-29

## 2018-08-19 MED ORDER — CLOPIDOGREL BISULFATE 75 MG PO TABS
75 MG | ORAL_TABLET | ORAL | 0 refills | Status: DC
Start: 2018-08-19 — End: 2018-09-17

## 2018-08-19 MED ORDER — ATORVASTATIN CALCIUM 80 MG PO TABS
80 MG | ORAL_TABLET | Freq: Every day | ORAL | 0 refills | Status: DC
Start: 2018-08-19 — End: 2018-09-17

## 2018-08-19 MED ORDER — FUROSEMIDE 20 MG PO TABS
20 MG | ORAL_TABLET | ORAL | 0 refills | Status: DC
Start: 2018-08-19 — End: 2018-09-17

## 2018-09-17 MED ORDER — FUROSEMIDE 20 MG PO TABS
20 MG | ORAL_TABLET | ORAL | 0 refills | Status: DC
Start: 2018-09-17 — End: 2018-10-18

## 2018-09-17 MED ORDER — CLOPIDOGREL BISULFATE 75 MG PO TABS
75 MG | ORAL_TABLET | ORAL | 0 refills | Status: DC
Start: 2018-09-17 — End: 2018-10-18

## 2018-09-17 MED ORDER — ATORVASTATIN CALCIUM 80 MG PO TABS
80 MG | ORAL_TABLET | Freq: Every day | ORAL | 0 refills | Status: DC
Start: 2018-09-17 — End: 2018-10-18

## 2018-09-17 NOTE — Telephone Encounter (Signed)
Last ov 07/02/17  Pending appt cx covid19  Last refill all meds 08/18/18 #30 NR  Last labs none

## 2018-09-28 NOTE — Telephone Encounter (Signed)
Last ov 07/02/17  Pending appt overdue for apt  Last refill 08/18/18 #30 NR

## 2018-09-29 MED ORDER — ASPIRIN ADULT LOW STRENGTH 81 MG PO TBEC
81 MG | ORAL_TABLET | ORAL | 2 refills | Status: DC
Start: 2018-09-29 — End: 2018-11-26

## 2018-10-18 MED ORDER — FUROSEMIDE 20 MG PO TABS
20 MG | ORAL_TABLET | ORAL | 0 refills | Status: AC
Start: 2018-10-18 — End: ?

## 2018-10-18 MED ORDER — CLOPIDOGREL BISULFATE 75 MG PO TABS
75 MG | ORAL_TABLET | ORAL | 0 refills | Status: DC
Start: 2018-10-18 — End: 2018-11-26

## 2018-10-18 MED ORDER — ATORVASTATIN CALCIUM 80 MG PO TABS
80 MG | ORAL_TABLET | Freq: Every day | ORAL | 0 refills | Status: DC
Start: 2018-10-18 — End: 2018-11-18

## 2018-10-18 NOTE — Telephone Encounter (Signed)
Last ov 07/02/17  Pending appt overdue for apt  Last refill all meds 09/17/18 #30  NR  Last labs none

## 2018-11-10 ENCOUNTER — Ambulatory Visit
Admit: 2018-11-10 | Discharge: 2018-11-10 | Payer: PRIVATE HEALTH INSURANCE | Attending: Nephrology | Primary: Nurse Practitioner

## 2018-11-10 DIAGNOSIS — E876 Hypokalemia: Secondary | ICD-10-CM

## 2018-11-10 NOTE — Progress Notes (Signed)
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  NO DISCRETE SOLID     MASS IDENTIFIED.  CONSIDER A FOLLOW-UP EXAM (EITHER RENAL MASS     PROTOCOL CT, MRI, OR ULTRASOUND) IN 6 MONTHS TO EVALUATE FOR     STABILITY IN LIGHT OF THE FINDINGS ON THE PRIOR MRI.      Pt has hypokalemia - on KCL       Has hx of CAD - s/p stent 2 years ago     On lasix 20 daily          Pt denies any hx of heavy or prolonged NSAID use and there is no history of OTC herbal medications . No history of dysuria or frequency.  Pt denies any hx of recent UTI , incontinence or nocturia or recurrent nephrolithiasis. No unusual skin rashes . No tea coloured urine .No recent procedures involving IV contrast.     Patient Active Problem List    Diagnosis Date Noted   ??? Tobacco use disorder      Overview Note:     Cessation discussed     ??? HTN (hypertension)    ??? HLD (hyperlipidemia)      Overview Note:     taking statin.      ??? COPD (chronic obstructive pulmonary disease) ([place])      Overview Note:     managed by PCP.     ??? CHF (congestive heart failure) (HCC)      Overview Note:     Hx.     ??? CAD (coronary artery disease)      Overview Note:     S/P MI and PCI     ??? Hepatitis C      Overview Note:     Hx           CURRENT MEDICATIONS      Current Outpatient Medications   Medication Sig Dispense Refill   ??? atorvastatin (LIPITOR) 80 MG tablet TAKE 1 TABLET BY MOUTH DAILY 30 tablet 0   ??? clopidogrel (PLAVIX) 75 MG tablet TAKE 1 TABLET BY MOUTH EVERY DAY 30 tablet 0   ??? furosemide (LASIX) 20 MG tablet TAKE 1 TABLET BY MOUTH DAILY AS NEEDED 30 tablet 0   ??? ASPIRIN ADULT LOW STRENGTH 81 MG EC tablet TAKE 1 TABLET BY MOUTH DAILY 30  tablet 2   ??? carvedilol (COREG) 3.125 MG tablet Take 1 tablet by mouth 2 times daily (with meals) 60 tablet 0   ??? traZODone (DESYREL) 150 MG tablet Take 150 mg by mouth nightly     ??? topiramate (TOPIRAGEN) 50 MG tablet Take 50 mg by mouth 2 times daily     ??? omeprazole (PRILOSEC) 40 MG delayed release capsule Take 40 mg by mouth daily       No current facility-administered medications for this visit.        REVIEW OF SYSTEMS     Constitutional: No  fever, no chills, no lethargy, no weakness.  HEENT:  No headache, otalgia, itchy eyes, nasal discharge or sore throat.  Cardiac:  No chest pain, dyspnea, orthopnea or PND.  Chest:  No cough, phlegm or wheezing or hemoptysis .  Abdomen:  No abdominal pain, nausea or vomiting.  Neuro:  No focal weakness, abnormal movements or seizure like activity.  Skin:   No rashes, no itching.  GU:   No hematuria, no pyuria, no dysuria, no flank pain.  Extremities:  No swelling or joint pains.  ROS was otherwise negative except as mentioned in the HOPI.     OBJECTIVE      There were no vitals filed for this visit.  Reported by patient   Wt Readings from Last 2 Encounters:   07/02/17 130 lb (59 kg)     Reported by patient     PHYSICAL EXAM      GENERAL APPEARANCE:Awake, alert, in no acute distress  EXTREMITIES: no cyanosis, clubbing or edema  NEURO: alert and oriented, no deficits        LABS    CBC: No results found for: WBC, HGB, HCT, MCV, RDW, PLT, MPV   BMP: No results found for: NA, K, CL, CO2, BUN, CREATININE, GLUCOSE, CALCIUM   PHOSPHORUS:  No results found for: PHOS  MAGNESIUM: No results found for: MG  ALBUMIN: No results found for: LABALBU  PTH: No components found for: PTHINTACT  VIT D3,25 HYDROXY: No results found for: VITD3     IRON:  No results found for: IRON  IRON SATURATION:  No results found for: LABIRON  TIBC:  No results found for: TIBC  FERRITIN:  No results found for: FERRITIN          ANA: No results found for: ANA    SPEP: No results found for: PROT, ALBCAL, ALBCAL,  ALBPCT, LABALPH, A1PCT, LABALPH, A2PCT, LABBETA, BETAPCT, GAMGLOB, GGPCT, PATH  UPEP: No results found for: TPU   HEPBSAG:No results found for: HEPBSAG  HEPCAB:No results found for: HEPCAB  C3: No results found for: C3  C4: No results found for: C4  MPO ANCA: No results found for: MPO .  PR3 ANCA:  No results found for: PR3                   URINALYSIS/URINE CHEMISTRIES      URINE CREATININE:  No results found for: LABCREA  URINE EOSINOPHILS : No results found for: UREO  URINE PROTEIN:  No results found for: TPU        RADIOLOGY    No results found for this or any previous visit.     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  NO DISCRETE SOLID     MASS IDENTIFIED.  CONSIDER A FOLLOW-UP EXAM (EITHER RENAL MASS     PROTOCOL CT, MRI, OR ULTRASOUND) IN 6 MONTHS TO EVALUATE FOR     STABILITY IN LIGHT OF THE FINDINGS ON THE PRIOR MRI.      ASSESSMENT/ Plan     Renal cyst - seems to be simple cyst - need repeat USG in 6 months     Hypokalemia - r/o Hyperaldo state   - check aldo/renin  - cont Kcl   - check Mag     Rec renal stone   - will need 24 hour urine     HTN   - controlled  CAD   - s/p stent   - on       Compliant with meds : yes     Time spent :    22 mins      approx.     Follow up : aug 19 - 2 pm    This is a telehealth visit done with verbal consent of patient           Patient was advised to avoid NSAIDS.    Please do not hesitate to call with questions.        Annye Forrey  [facility].  Office : [phone]  Fax :[phone]

## 2018-11-18 MED ORDER — ATORVASTATIN CALCIUM 80 MG PO TABS
80 MG | ORAL_TABLET | Freq: Every day | ORAL | 2 refills | Status: DC
Start: 2018-11-18 — End: 2019-01-17

## 2018-11-18 NOTE — Telephone Encounter (Signed)
Last ov 07/02/17  Pending appt cx covid 19  Last refill 10/18/18 #30 NR

## 2018-11-24 ENCOUNTER — Encounter: Attending: Nephrology | Primary: Nurse Practitioner

## 2018-11-26 MED ORDER — ASPIRIN ADULT LOW STRENGTH 81 MG PO TBEC
81 MG | ORAL_TABLET | ORAL | 0 refills | Status: AC
Start: 2018-11-26 — End: ?

## 2018-11-26 MED ORDER — CLOPIDOGREL BISULFATE 75 MG PO TABS
75 MG | ORAL_TABLET | ORAL | 0 refills | Status: DC
Start: 2018-11-26 — End: 2019-02-25

## 2018-11-26 NOTE — Telephone Encounter (Signed)
Last ov 07/02/17  Pending appt overdue for apt  Last refill plavix 10/18/18 #30 NR, aspirin 09/29/18 #30x2

## 2018-12-08 ENCOUNTER — Ambulatory Visit
Admit: 2018-12-08 | Discharge: 2018-12-08 | Payer: PRIVATE HEALTH INSURANCE | Attending: Nephrology | Primary: Nurse Practitioner

## 2018-12-08 DIAGNOSIS — E878 Other disorders of electrolyte and fluid balance, not elsewhere classified: Secondary | ICD-10-CM

## 2018-12-08 MED ORDER — SPIRONOLACTONE 25 MG PO TABS
25 MG | ORAL_TABLET | Freq: Every day | ORAL | 3 refills | Status: DC
Start: 2018-12-08 — End: 2019-03-13

## 2018-12-08 NOTE — Progress Notes (Signed)
Jerene DillingLoren Cohen MD   Allegra GranaArshdeep Mykenzi Vanzile MD FASN FNKF  Elyse HsuVishesh Puri MD   Ezzard StandingMandeep Gill MD      [facility]      Nephrology Consult Note    12/08/18     Pt has Hypokalemia   Better   K 4.4     Past Medical History:        Diagnosis Date   ??? CAD (coronary artery disease)     S/P MI and PCI   ??? CHF (congestive heart failure) (HCC)     Hx.   ??? COPD (chronic obstructive pulmonary disease) (HCC)     managed by PCP.   ??? Hepatitis C     Hx   ??? HLD (hyperlipidemia)     taking statin.    ??? HTN (hypertension)    ??? Tobacco use disorder     Cessation discussed       Past Surgical History:        Procedure Laterality Date   ??? CESAREAN SECTION     ??? CORONARY ANGIOPLASTY WITH STENT PLACEMENT     ??? TONSILLECTOMY     ??? TUBAL LIGATION     ??? UPPER GASTROINTESTINAL ENDOSCOPY     ??? URETER STENT PLACEMENT         Current Medications:    Current Outpatient Medications   Medication Sig Dispense Refill   ??? furosemide (LASIX) 20 MG tablet TAKE 1 TABLET BY MOUTH DAILY AS NEEDED (Patient taking differently: Take 20 mg by mouth as needed ) 30 tablet 0   ??? clopidogrel (PLAVIX) 75 MG tablet TAKE 1 TABLET BY MOUTH EVERY DAY 90 tablet 0   ??? ASPIRIN ADULT LOW STRENGTH 81 MG EC tablet TAKE 1 TABLET BY MOUTH DAILY 90 tablet 0   ??? atorvastatin (LIPITOR) 80 MG tablet TAKE 1 TABLET BY MOUTH DAILY 30 tablet 2   ??? carvedilol (COREG) 3.125 MG tablet Take 1 tablet by mouth 2 times daily (with meals) 60 tablet 0   ??? traZODone (DESYREL) 150 MG tablet Take 150 mg by mouth nightly     ??? topiramate (TOPIRAGEN) 50 MG tablet Take 50 mg by mouth 2 times daily     ??? omeprazole (PRILOSEC) 40 MG delayed release capsule Take 40 mg by mouth daily       No current facility-administered medications for this visit.        Allergies:  Demerol hcl [meperidine] and Dye [iodides]    Social History:   Social History     Socioeconomic History   ??? Marital status: Divorced     Spouse name: Not on file   ??? Number of children: Not on file   ??? Years of  education: Not on file   ??? Highest education level: Not on file   Occupational History   ??? Not on file   Social Needs   ??? Financial resource strain: Not on file   ??? Food insecurity     Worry: Not on file     Inability: Not on file   ??? Transportation needs     Medical: Not on file     Non-medical: Not on file   Tobacco Use   ??? Smoking status: Current Every Day Smoker     Packs/day: 0.50     Types: Cigarettes   ??? Smokeless tobacco: Never Used   Substance and Sexual Activity   ??? Alcohol use: Never     Frequency: Never   ???  Drug use: Never   ??? Sexual activity: Not on file   Lifestyle   ??? Physical activity     Days per week: Not on file     Minutes per session: Not on file   ??? Stress: Not on file   Relationships   ??? Social Wellsite geologistconnections     Talks on phone: Not on file     Gets together: Not on file     Attends religious service: Not on file     Active member of club or organization: Not on file     Attends meetings of clubs or organizations: Not on file     Relationship status: Not on file   ??? Intimate partner violence     Fear of current or ex partner: Not on file     Emotionally abused: Not on file     Physically abused: Not on file     Forced sexual activity: Not on file   Other Topics Concern   ??? Not on file   Social History Narrative   ??? Not on file       Family History:   Family History   Problem Relation Age of Onset   ??? Heart Failure Mother        Review of Systems:    Constitutional: No fever, no chills, no lethargy, no weakness.  HEENT:  No headache, otalgia, itchy eyes, nasal discharge or sore throat.  Cardiac:  No chest pain, dyspnea, orthopnea or PND.  Chest:  No cough, phlegm or wheezing.  Abdomen:  No abdominal pain, nausea or vomiting.  Neuro:  No focal weakness, abnormal movements orseizure like activity.  Skin:   No rashes, no itching.  GU:   No hematuria, no pyuria, no dysuria, no flank pain.  Extremities:  No swelling or joint pains.      Objective:  BP 129/84 (Site: Left Upper Arm, Position: Sitting,  Cuff Size: Medium Adult)    Pulse 56    Temp 97.5 ??F (36.4 ??C)    Wt 112 lb (50.8 kg)    BMI 18.64 kg/m??     Physical Exam:  General appearance:Awake, alert, in no acute distress  Skin: warm and dry, no rash or erythema  Eyes: conjunctivae normal and sclera anicteric  ENT: :no thrush no pharyngeal congestion    Neck: without any JVD. No carotid bruits or thyromegaly. No  Lymphadenopathty:  Pulmonary: lungs are clear and without any wheezing or rhonchi   Cardiovascular:Normal S1 & S2, No S3 or  S4, No  Pericardial rub , No Murmur Abdomen: soft nontender, bowel sounds present, no organomegaly,  No ascites  Extremities: no cyanosis, clubbing or edema    Labs:    CBC: No results found for: WBC, RBC, HGB, HCT, MCV, RDW, PLT, MPV   BMP: No results found for: NA, K, CL, CO2, BUN, CREATININE, GLUCOSE, CALCIUM   BNP:No results found for: BNP  PHOSPHORUS:  No results found for: PHOS  MAGNESIUM: No results found for: MG  ALBUMIN: No results found for: LABALBU  IRON:  No results found for: IRON  IRON SATURATION:  No results found for: LABIRON  TIBC:  No results found for: TIBC  FERRITIN:  No results found for: FERRITIN  ANA: No results found for: ANA    SPEP: No results found for: PROT, ALBCAL, ALBCAL, ALBPCT, LABALPH, A1PCT, LABALPH, A2PCT, LABBETA, BETAPCT, GAMGLOB, GGPCT, PATH  UPEP: No results found for: TPU   HEPBSAG:No results found for: HEPBSAG  HEPCAB:No results found for: HEPCAB  C3: No results found for: C3  C4: No results found for: C4  MPO ANCA: No results found for: MPO .  PR3 ANCA:  No results found for: PR3  PTH: No results found for: PTH    Urine Creatinine:  No results found for: LABCREA  Urine Eosinophils: No results found for: UREO  Urine Protein:  No results found for: TPU  Urinalysis:  U/A: No results found for: NITRU, COLORU, PHUR, LABCAST, [place], RBCUA, MUCUS, TRICHOMONAS, YEAST, BACTERIA, CLARITYU, SPECGRAV, LEUKOCYTESUR, UROBILINOGEN, BILIRUBINUR, BLOODU, GLUCOSEU, [place],  [place]      Radiology:  Reviewed as available.    Assessment       Plan:    Renal cyst - seems to be simple cyst - need repeat USG in 6 months     Hypokalemia - r/o Hyperaldo state   - renin suppressed   - stop ACE  - on aldactone   - stop Kcl  - Mag 1.9     Rec renal stone   - monitor   - not interested in 24 hour urine studies     HTN   - controlled      CAD   - s/p stent   - on     Thank you for the consultation.  Please do not hesitate to call with questions.    Electronically signed by Norlene Duel, MD on 12/08/2018 at 12:19 PM

## 2019-01-17 MED ORDER — ATORVASTATIN CALCIUM 80 MG PO TABS
80 MG | ORAL_TABLET | Freq: Every day | ORAL | 0 refills | Status: DC
Start: 2019-01-17 — End: 2019-03-22

## 2019-01-17 NOTE — Telephone Encounter (Signed)
Last ov 07/02/17  Pending appt overdue for apt  Last refill 11/18/2018 #30x2  Last labs none

## 2019-02-25 MED ORDER — CLOPIDOGREL BISULFATE 75 MG PO TABS
75 MG | ORAL_TABLET | ORAL | 0 refills | Status: DC
Start: 2019-02-25 — End: 2019-03-29

## 2019-02-25 NOTE — Telephone Encounter (Signed)
Last ov 07/02/17  Pending appt overdue for apt  Last refill 11/26/18 #90 NR

## 2019-03-11 NOTE — Telephone Encounter (Signed)
Pt has not been seen since 07/02/17, pharm notified that pt will either have to make apt or get from her pcp

## 2019-03-11 NOTE — Telephone Encounter (Signed)
Last visit 12/08/2018  Next visit 03/06/2019

## 2019-03-13 MED ORDER — SPIRONOLACTONE 25 MG PO TABS
25 MG | ORAL_TABLET | Freq: Every day | ORAL | 3 refills | Status: DC
Start: 2019-03-13 — End: 2019-07-18

## 2019-03-21 NOTE — Telephone Encounter (Signed)
Last ov 07/02/17  Pending appt overdue for apt  Last refill 01/17/19 j#30 NR  Last labs none

## 2019-03-22 MED ORDER — ATORVASTATIN CALCIUM 80 MG PO TABS
80 MG | ORAL_TABLET | Freq: Every day | ORAL | 0 refills | Status: DC
Start: 2019-03-22 — End: 2019-04-29

## 2019-03-29 MED ORDER — CLOPIDOGREL BISULFATE 75 MG PO TABS
75 MG | ORAL_TABLET | ORAL | 0 refills | Status: AC
Start: 2019-03-29 — End: ?

## 2019-03-29 NOTE — Telephone Encounter (Signed)
Last ov 07/02/17  Pending appt overdue for apt  Last refill 02/25/19 #30NR    My chart message sent to pt of needing an apt.

## 2019-03-30 ENCOUNTER — Ambulatory Visit
Admit: 2019-03-30 | Discharge: 2019-03-30 | Payer: PRIVATE HEALTH INSURANCE | Attending: Nephrology | Primary: Nurse Practitioner

## 2019-03-30 DIAGNOSIS — N182 Chronic kidney disease, stage 2 (mild): Secondary | ICD-10-CM

## 2019-03-30 MED ORDER — CARVEDILOL 6.25 MG PO TABS
6.25 MG | ORAL_TABLET | Freq: Two times a day (BID) | ORAL | 5 refills | Status: AC
Start: 2019-03-30 — End: ?

## 2019-03-30 NOTE — Progress Notes (Signed)
Mammie Russian MD   Norlene Duel MD FASN FNKF  Dorathy Daft MD   Ladon Applebaum MD      [facility]      Nephrology Consult Note    03/30/19     Pt has Hypokalemia   Better   K 4.4     Past Medical History:        Diagnosis Date   ??? CAD (coronary artery disease)     S/P MI and PCI   ??? CHF (congestive heart failure) ([place])     Hx.   ??? COPD (chronic obstructive pulmonary disease) ([place])     managed by PCP.   ??? Hepatitis C     Hx   ??? HLD (hyperlipidemia)     taking statin.    ??? HTN (hypertension)    ??? Tobacco use disorder     Cessation discussed       Past Surgical History:        Procedure Laterality Date   ??? CESAREAN SECTION     ??? CORONARY ANGIOPLASTY WITH STENT PLACEMENT     ??? TONSILLECTOMY     ??? TUBAL LIGATION     ??? UPPER GASTROINTESTINAL ENDOSCOPY     ??? URETER STENT PLACEMENT         Current Medications:    Current Outpatient Medications   Medication Sig Dispense Refill   ??? clopidogrel (PLAVIX) 75 MG tablet TAKE 1 TABLET BY MOUTH EVERY DAY 30 tablet 0   ??? atorvastatin (LIPITOR) 80 MG tablet TAKE 1 TABLET BY MOUTH DAILY 30 tablet 0   ??? spironolactone (ALDACTONE) 25 MG tablet TAKE 1 TABLET BY MOUTH DAILY 30 tablet 3   ??? ASPIRIN ADULT LOW STRENGTH 81 MG EC tablet TAKE 1 TABLET BY MOUTH DAILY 90 tablet 0   ??? furosemide (LASIX) 20 MG tablet TAKE 1 TABLET BY MOUTH DAILY AS NEEDED (Patient taking differently: Take 20 mg by mouth as needed ) 30 tablet 0   ??? carvedilol (COREG) 3.125 MG tablet Take 1 tablet by mouth 2 times daily (with meals) 60 tablet 0   ??? traZODone (DESYREL) 150 MG tablet Take 150 mg by mouth nightly     ??? topiramate (TOPIRAGEN) 50 MG tablet Take 50 mg by mouth 2 times daily     ??? omeprazole (PRILOSEC) 40 MG delayed release capsule Take 40 mg by mouth daily       No current facility-administered medications for this visit.        Allergies:  Demerol hcl [meperidine] and Dye [iodides]    Social History:   Social History     Socioeconomic History   ??? Marital status:  Divorced     Spouse name: Not on file   ??? Number of children: Not on file   ??? Years of education: Not on file   ??? Highest education level: Not on file   Occupational History   ??? Not on file   Social Needs   ??? Financial resource strain: Not on file   ??? Food insecurity     Worry: Not on file     Inability: Not on file   ??? Transportation needs     Medical: Not on file     Non-medical: Not on file   Tobacco Use   ??? Smoking status: Current Every Day Smoker     Packs/day: 0.50     Types: Cigarettes   ??? Smokeless tobacco: Never Used   Substance and  Sexual Activity   ??? Alcohol use: Never     Frequency: Never   ??? Drug use: Never   ??? Sexual activity: Not on file   Lifestyle   ??? Physical activity     Days per week: Not on file     Minutes per session: Not on file   ??? Stress: Not on file   Relationships   ??? Social Wellsite geologistconnections     Talks on phone: Not on file     Gets together: Not on file     Attends religious service: Not on file     Active member of club or organization: Not on file     Attends meetings of clubs or organizations: Not on file     Relationship status: Not on file   ??? Intimate partner violence     Fear of current or ex partner: Not on file     Emotionally abused: Not on file     Physically abused: Not on file     Forced sexual activity: Not on file   Other Topics Concern   ??? Not on file   Social History Narrative   ??? Not on file       Family History:   Family History   Problem Relation Age of Onset   ??? Heart Failure Mother        Review of Systems:    Constitutional: No fever, no chills, no lethargy, no weakness.  HEENT:  No headache, otalgia, itchy eyes, nasal discharge or sore throat.  Cardiac:  No chest pain, dyspnea, orthopnea or PND.  Chest:  No cough, phlegm or wheezing.  Abdomen:  No abdominal pain, nausea or vomiting.  Neuro:  No focal weakness, abnormal movements orseizure like activity.  Skin:   No rashes, no itching.  GU:   No hematuria, no pyuria, no dysuria, no flank pain.  Extremities:  No swelling  or joint pains.      Objective:  BP (!) 146/93    Pulse 62    Temp 97.4 ??F (36.3 ??C)    Wt 121 lb (54.9 kg)    BMI 20.14 kg/m??     Physical Exam:  General appearance:Awake, alert, in no acute distress  Skin: warm and dry, no rash or erythema  Eyes: conjunctivae normal and sclera anicteric  ENT: :no thrush no pharyngeal congestion    Neck: without any JVD. No carotid bruits or thyromegaly. No  Lymphadenopathty:  Pulmonary: lungs are clear and without any wheezing or rhonchi   Cardiovascular:Normal S1 & S2, No S3 or  S4, No  Pericardial rub , No Murmur Abdomen: soft nontender, bowel sounds present, no organomegaly,  No ascites  Extremities: no cyanosis, clubbing or edema    Labs:    CBC: No results found for: WBC, RBC, HGB, HCT, MCV, RDW, PLT, MPV   BMP: No results found for: NA, K, CL, CO2, BUN, CREATININE, GLUCOSE, CALCIUM   BNP:No results found for: BNP  PHOSPHORUS:  No results found for: PHOS  MAGNESIUM: No results found for: MG  ALBUMIN: No results found for: LABALBU  IRON:  No results found for: IRON  IRON SATURATION:  No results found for: LABIRON  TIBC:  No results found for: TIBC  FERRITIN:  No results found for: FERRITIN  ANA: No results found for: ANA    SPEP: No results found for: PROT, ALBCAL, ALBCAL, ALBPCT, LABALPH, A1PCT, LABALPH, A2PCT, LABBETA, BETAPCT, GAMGLOB, GGPCT, PATH  UPEP: No results found for: TPU  HEPBSAG:No results found for: HEPBSAG  HEPCAB:No results found for: HEPCAB  C3: No results found for: C3  C4: No results found for: C4  MPO ANCA: No results found for: MPO .  PR3 ANCA:  No results found for: PR3  PTH: No results found for: PTH    Urine Creatinine:  No results found for: LABCREA  Urine Eosinophils: No results found for: UREO  Urine Protein:  No results found for: TPU  Urinalysis:  U/A: No results found for: NITRU, COLORU, PHUR, LABCAST, WBCUA, RBCUA, MUCUS, TRICHOMONAS, YEAST, BACTERIA, CLARITYU, SPECGRAV, LEUKOCYTESUR, UROBILINOGEN, BILIRUBINUR, BLOODU, GLUCOSEU, KETUA,  AMORPHOUS      Radiology:  Reviewed as available.    Assessment       Plan:    Renal cyst - seems to be simple cyst - need repeat USG in 6 months     Hypokalemia - r/o Hyperaldo state   - renin suppressed   - stop ACE  - on aldactone   - stop Kcl  - Mag 1.9     Rec renal stone   - monitor   -  interested in 24 hour urine studies     HTN   - controlled      CAD   - s/p stent   - on     Thank you for the consultation.  Please do not hesitate to call with questions.    Electronically signed by Allegra Grana, MD on 03/30/2019 at 3:52 PM

## 2019-04-25 NOTE — Telephone Encounter (Signed)
Pt will make an apt here but now she is driving and will call us back when she gets home

## 2019-04-26 NOTE — Telephone Encounter (Signed)
[phone] (home)   Left message on pts machine to call back

## 2019-04-29 MED ORDER — ATORVASTATIN CALCIUM 80 MG PO TABS
80 MG | ORAL_TABLET | Freq: Every day | ORAL | 0 refills | Status: AC
Start: 2019-04-29 — End: ?

## 2019-04-29 NOTE — Telephone Encounter (Signed)
Pt has made an apt with Dr. Zola Button for 05/17/19, she will need a refill

## 2019-05-17 ENCOUNTER — Encounter: Attending: Internal Medicine | Primary: Nurse Practitioner

## 2019-05-31 ENCOUNTER — Encounter: Attending: Internal Medicine | Primary: Nurse Practitioner

## 2019-07-18 MED ORDER — SPIRONOLACTONE 25 MG PO TABS
25 MG | ORAL_TABLET | Freq: Every day | ORAL | 3 refills | Status: AC
Start: 2019-07-18 — End: ?

## 2019-08-03 ENCOUNTER — Encounter: Attending: Nephrology | Primary: Nurse Practitioner

## 2019-08-10 ENCOUNTER — Ambulatory Visit
Admit: 2019-08-10 | Discharge: 2019-08-10 | Payer: PRIVATE HEALTH INSURANCE | Attending: Nephrology | Primary: Nurse Practitioner

## 2019-08-10 DIAGNOSIS — N182 Chronic kidney disease, stage 2 (mild): Secondary | ICD-10-CM

## 2019-12-14 ENCOUNTER — Encounter: Attending: Nephrology | Primary: Nurse Practitioner

## 2019-12-28 ENCOUNTER — Ambulatory Visit
Admit: 2019-12-28 | Discharge: 2019-12-28 | Payer: PRIVATE HEALTH INSURANCE | Attending: Nephrology | Primary: Nurse Practitioner

## 2019-12-28 DIAGNOSIS — E878 Other disorders of electrolyte and fluid balance, not elsewhere classified: Secondary | ICD-10-CM

## 2019-12-28 NOTE — Progress Notes (Signed)
Jerene Dilling MD   Allegra Grana MD FASN FNKF  Elyse Hsu MD   Ezzard Standing MD      [facility]      Nephrology Consult Note    12/28/19     Pt had Hypokalemia   K better    On aldactone   BP controlled   No N/V/D       Past Medical History:        Diagnosis Date   ??? CAD (coronary artery disease)     S/P MI and PCI   ??? CHF (congestive heart failure) (HCC)     Hx.   ??? COPD (chronic obstructive pulmonary disease) (HCC)     managed by PCP.   ??? Hepatitis C     Hx   ??? HLD (hyperlipidemia)     taking statin.    ??? HTN (hypertension)    ??? Tobacco use disorder     Cessation discussed       Past Surgical History:        Procedure Laterality Date   ??? CESAREAN SECTION     ??? CORONARY ANGIOPLASTY WITH STENT PLACEMENT     ??? TONSILLECTOMY     ??? TUBAL LIGATION     ??? UPPER GASTROINTESTINAL ENDOSCOPY     ??? URETER STENT PLACEMENT         Current Medications:    Current Outpatient Medications   Medication Sig Dispense Refill   ??? busPIRone (BUSPAR) 10 MG tablet Take 10 mg by mouth 2 times daily     ??? spironolactone (ALDACTONE) 25 MG tablet TAKE 1 TABLET BY MOUTH DAILY 30 tablet 3   ??? atorvastatin (LIPITOR) 80 MG tablet TAKE 1 TABLET BY MOUTH DAILY 90 tablet 0   ??? carvedilol (COREG) 6.25 MG tablet Take 1 tablet by mouth 2 times daily 60 tablet 5   ??? clopidogrel (PLAVIX) 75 MG tablet TAKE 1 TABLET BY MOUTH EVERY DAY 30 tablet 0   ??? ASPIRIN ADULT LOW STRENGTH 81 MG EC tablet TAKE 1 TABLET BY MOUTH DAILY 90 tablet 0   ??? furosemide (LASIX) 20 MG tablet TAKE 1 TABLET BY MOUTH DAILY AS NEEDED (Patient taking differently: Take 20 mg by mouth as needed ) 30 tablet 0   ??? traZODone (DESYREL) 150 MG tablet Take 150 mg by mouth nightly     ??? topiramate (TOPIRAGEN) 50 MG tablet Take 50 mg by mouth 2 times daily     ??? omeprazole (PRILOSEC) 40 MG delayed release capsule Take 40 mg by mouth daily       No current facility-administered medications for this visit.       Allergies:  Demerol hcl [meperidine] and Dye  [iodides]    Social History:   Social History     Socioeconomic History   ??? Marital status: Divorced     Spouse name: Not on file   ??? Number of children: Not on file   ??? Years of education: Not on file   ??? Highest education level: Not on file   Occupational History   ??? Not on file   Tobacco Use   ??? Smoking status: Current Every Day Smoker     Packs/day: 0.50     Types: Cigarettes   ??? Smokeless tobacco: Never Used   Vaping Use   ??? Vaping Use: Never used   Substance and Sexual Activity   ??? Alcohol use: Never   ??? Drug use: Never   ???  Sexual activity: Not on file   Other Topics Concern   ??? Not on file   Social History Narrative   ??? Not on file     Social Determinants of Health     Financial Resource Strain:    ??? Difficulty of Paying Living Expenses:    Food Insecurity:    ??? Worried About Programme researcher, broadcasting/film/video in the Last Year:    ??? Barista in the Last Year:    Transportation Needs:    ??? Freight forwarder (Medical):    ??? Lack of Transportation (Non-Medical):    Physical Activity:    ??? Days of Exercise per Week:    ??? Minutes of Exercise per Session:    Stress:    ??? Feeling of Stress :    Social Connections:    ??? Frequency of Communication with Friends and Family:    ??? Frequency of Social Gatherings with Friends and Family:    ??? Attends Religious Services:    ??? Database administrator or Organizations:    ??? Attends Engineer, structural:    ??? Marital Status:    Intimate Programme researcher, broadcasting/film/video Violence:    ??? Fear of Current or Ex-Partner:    ??? Emotionally Abused:    ??? Physically Abused:    ??? Sexually Abused:        Family History:   Family History   Problem Relation Age of Onset   ??? Heart Failure Mother        Review of Systems:    Constitutional: No fever, no chills, no lethargy, no weakness.  HEENT:  No headache, otalgia, itchy eyes, nasal discharge or sore throat.  Cardiac:  No chest pain, dyspnea, orthopnea or PND.  Chest:  No cough, phlegm or wheezing.  Abdomen:  No abdominal pain, nausea or vomiting.  Neuro:  No  focal weakness, abnormal movements orseizure like activity.  Skin:   No rashes, no itching.  GU:   No hematuria, no pyuria, no dysuria, no flank pain.  Extremities:  No swelling or joint pains.      Objective:  BP (!) 134/98    Pulse 60    Temp 97.4 ??F (36.3 ??C)    Wt 132 lb 14.4 oz (60.3 kg)    BMI 22.12 kg/m??     Physical Exam:  General appearance:Awake, alert, in no acute distress  Skin: warm and dry, no rash or erythema  Eyes: conjunctivae normal and sclera anicteric  ENT: :no thrush no pharyngeal congestion    Neck: without any JVD. No carotid bruits or thyromegaly. No  Lymphadenopathty:  Pulmonary: lungs are clear and without any wheezing or rhonchi   Cardiovascular:Normal S1 & S2, No S3 or  S4, No  Pericardial rub , No Murmur Abdomen: soft nontender, bowel sounds present, no organomegaly,  No ascites  Extremities: no cyanosis, clubbing or edema    Labs:    CBC: No results found for: WBC, RBC, HGB, HCT, MCV, RDW, PLT, MPV   BMP: No results found for: NA, K, CL, CO2, BUN, CREATININE, GLUCOSE, CALCIUM   BNP:No results found for: BNP  PHOSPHORUS:  No results found for: PHOS  MAGNESIUM: No results found for: MG  ALBUMIN: No results found for: LABALBU  IRON:  No results found for: IRON  IRON SATURATION:  No results found for: LABIRON  TIBC:  No results found for: TIBC  FERRITIN:  No results found for: FERRITIN  ANA: No results found  for: ANA    SPEP: No results found for: PROT, ALBCAL, ALBCAL, ALBPCT, LABALPH, A1PCT, LABALPH, A2PCT, LABBETA, BETAPCT, GAMGLOB, GGPCT, PATH  UPEP: No results found for: TPU   HEPBSAG:No results found for: HEPBSAG  HEPCAB:No results found for: HEPCAB  C3: No results found for: C3  C4: No results found for: C4  MPO ANCA: No results found for: MPO .  PR3 ANCA:  No results found for: PR3  PTH: No results found for: PTH    Urine Creatinine:  No results found for: LABCREA  Urine Eosinophils: No results found for: UREO  Urine Protein:  No results found for: TPU  Urinalysis:  U/A: No results  found for: NITRU, COLORU, PHUR, LABCAST, WBCUA, RBCUA, MUCUS, TRICHOMONAS, YEAST, BACTERIA, CLARITYU, SPECGRAV, LEUKOCYTESUR, UROBILINOGEN, BILIRUBINUR, BLOODU, GLUCOSEU, KETUA, AMORPHOUS      Radiology:  Reviewed as available.    Assessment       Plan:    Renal cyst - seems to be simple cyst no further w/u needed     Hypokalemia - r/o Hyperaldo state   - renin suppressed   - stop ACE  - on aldactone   - stop Kcl  - Mag 1.9     Rec renal stone   -  monitor   -  interested in 24 hour urine studies     HTN   - controlled      CAD   - s/p stent   - on     Thank you for the consultation.  Please do not hesitate to call with questions.    Electronically signed by Allegra Grana, MD on 12/28/2019 at 1:24 PM

## 2020-05-02 ENCOUNTER — Encounter: Attending: Nephrology | Primary: Nurse Practitioner

## 2020-07-20 ENCOUNTER — Encounter: Payer: Self-pay | Admitting: *Deleted

## 2020-07-23 ENCOUNTER — Ambulatory Visit: Payer: BLUE CROSS/BLUE SHIELD | Admitting: Cardiology
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
## 2020-08-09 ENCOUNTER — Encounter: Payer: Self-pay | Admitting: Cardiology

## 2020-10-29 ENCOUNTER — Ambulatory Visit: Payer: BLUE CROSS/BLUE SHIELD | Admitting: Cardiology
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
## 2021-02-06 ENCOUNTER — Other Ambulatory Visit (HOSPITAL_COMMUNITY): Payer: Self-pay | Admitting: Family Medicine

## 2021-02-06 DIAGNOSIS — Z1231 Encounter for screening mammogram for malignant neoplasm of breast: Secondary | ICD-10-CM

## 2021-02-15 ENCOUNTER — Ambulatory Visit (HOSPITAL_COMMUNITY): Payer: BLUE CROSS/BLUE SHIELD

## 2021-02-20 ENCOUNTER — Encounter: Payer: Self-pay | Admitting: Cardiology

## 2021-03-06 ENCOUNTER — Encounter: Payer: Self-pay | Admitting: *Deleted
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
## 2021-03-11 ENCOUNTER — Ambulatory Visit: Payer: BLUE CROSS/BLUE SHIELD | Admitting: Cardiology

## 2021-03-11 DIAGNOSIS — I25119 Atherosclerotic heart disease of native coronary artery with unspecified angina pectoris: Secondary | ICD-10-CM

## 2021-03-19 ENCOUNTER — Encounter: Payer: Self-pay | Admitting: Cardiology
